# Patient Record
Sex: Male | Born: 1985 | Race: Black or African American | Hispanic: No
Health system: Southern US, Academic
[De-identification: ages and names within clinical notes are randomized; demographics above are authoritative.]

## PROBLEM LIST (undated history)

## (undated) ENCOUNTER — Telehealth

## (undated) ENCOUNTER — Encounter: Attending: Oncology | Primary: Oncology

## (undated) ENCOUNTER — Encounter

## (undated) ENCOUNTER — Telehealth: Attending: Hematology | Primary: Hematology

## (undated) ENCOUNTER — Encounter: Attending: Adult Health | Primary: Adult Health

## (undated) ENCOUNTER — Encounter: Attending: Hematology | Primary: Hematology

## (undated) ENCOUNTER — Ambulatory Visit: Payer: PRIVATE HEALTH INSURANCE

## (undated) ENCOUNTER — Encounter: Payer: PRIVATE HEALTH INSURANCE | Attending: Clinical | Primary: Clinical

## (undated) ENCOUNTER — Ambulatory Visit

## (undated) ENCOUNTER — Encounter: Payer: PRIVATE HEALTH INSURANCE | Attending: Hematology | Primary: Hematology

## (undated) ENCOUNTER — Telehealth: Attending: Oncology | Primary: Oncology

## (undated) ENCOUNTER — Encounter: Attending: Internal Medicine | Primary: Internal Medicine

## (undated) ENCOUNTER — Encounter
Attending: Student in an Organized Health Care Education/Training Program | Primary: Student in an Organized Health Care Education/Training Program

## (undated) ENCOUNTER — Encounter
Payer: PRIVATE HEALTH INSURANCE | Attending: Rehabilitative and Restorative Service Providers" | Primary: Rehabilitative and Restorative Service Providers"

## (undated) ENCOUNTER — Ambulatory Visit: Payer: MEDICAID

## (undated) ENCOUNTER — Encounter: Attending: Clinical | Primary: Clinical

## (undated) ENCOUNTER — Ambulatory Visit: Payer: PRIVATE HEALTH INSURANCE | Attending: Adult Health | Primary: Adult Health

## (undated) ENCOUNTER — Ambulatory Visit: Attending: Clinical | Primary: Clinical

## (undated) ENCOUNTER — Telehealth: Attending: Clinical | Primary: Clinical

## (undated) ENCOUNTER — Telehealth: Attending: Adult Health | Primary: Adult Health

## (undated) ENCOUNTER — Telehealth: Attending: Social Worker | Primary: Social Worker

## (undated) ENCOUNTER — Telehealth: Attending: Nephrology | Primary: Nephrology

## (undated) ENCOUNTER — Telehealth
Attending: Student in an Organized Health Care Education/Training Program | Primary: Student in an Organized Health Care Education/Training Program

## (undated) DIAGNOSIS — D571 Sickle-cell disease without crisis: Secondary | ICD-10-CM

## (undated) DIAGNOSIS — D5700 Hb-SS disease with crisis, unspecified: Secondary | ICD-10-CM

## (undated) HISTORY — PX: HERNIA REPAIR: SHX51

## (undated) HISTORY — PX: CHOLECYSTECTOMY: SHX55

---

## 2016-08-12 ENCOUNTER — Ambulatory Visit
Admit: 2016-08-12 | Discharge: 2016-08-12 | Payer: PRIVATE HEALTH INSURANCE | Attending: Internal Medicine | Primary: Internal Medicine

## 2016-08-12 DIAGNOSIS — Z09 Encounter for follow-up examination after completed treatment for conditions other than malignant neoplasm: Secondary | ICD-10-CM

## 2016-08-12 MED ORDER — OXYCODONE-ACETAMINOPHEN 5 MG-325 MG TAB
5-325 mg | ORAL_TABLET | ORAL | 0 refills | Status: DC
Start: 2016-08-12 — End: 2016-11-22

## 2016-08-12 NOTE — Progress Notes (Signed)
History:   John Acevedo is a 30 y.o. male presenting today for an initial visit for Establish Care and Hospital Follow Up [facility])    Pt presents to establish care and for follow-up after hospital discharge.  History is significant for sickle cell anemia and pain crises.  He was previously followed by his hematologist in [place].   He reports major pain crises involving his chest and abdomin usually twice per year.  Over the last month he has experienced 2 episodes.  He has responded well to ibuprofen and oxycodone in the past.  Pt was hospitalized at [facility] from 08/06/16 to 08/09/16 with c/o sickle cell pain.  Pt presented to ED c/o chest and abdominal pain x1 week with associated productive cough, SOB, and diarrhea. Evaluation in the ED revealed oxygen saturation into the 80s that improved with 2-3 L nasal canula.  An EKG showed NSR.  CXR showed basilar increased markings greater on the right that was possibly an early infiltrate.   He received IV normal saline, IV Zosyn and Zithromax.  Pt was admitted then placed on ceftriaxone and azithromycin.  A CBC revealed leukocytosis and hemoglobin of 6.9.  He received 2 units of prbc with improvement to 9.9.  He received pain control with oxycodone.  Leukocytosis improved and he remained afebrile during admission.  He was discharged in stable condition on azithromycin and cefdinir.  There has been mild improvement in pain since discharge.         Past Medical History:   Diagnosis Date   ??? Sickle cell disease (HCC)        Past Surgical History:   Procedure Laterality Date   ??? HX CHOLECYSTECTOMY     ??? HX HERNIA REPAIR         Social History     Social History   ??? Marital status: UNKNOWN     Spouse name: N/A   ??? Number of children: N/A   ??? Years of education: N/A     Occupational History   ??? Not on file.     Social History Main Topics   ??? Smoking status: Former Smoker     Packs/day: 1.00     Years: 4.00     Quit date: 08/13/2015   ??? Smokeless tobacco: Former Neurosurgeon    ??? Alcohol use No   ??? Drug use: No   ??? Sexual activity: Yes     Other Topics Concern   ??? Not on file     Social History Narrative   ??? No narrative on file       Family History   Problem Relation Age of Onset   ??? Diabetes Maternal Grandmother        No current outpatient prescriptions on file prior to visit.     No current facility-administered medications on file prior to visit.        No Known Allergies    Review of Systems   Constitutional: Negative for chills, fever, malaise/fatigue and weight loss.   HENT: Negative.    Eyes: Negative for blurred vision.   Respiratory: Positive for cough and sputum production. Negative for hemoptysis, shortness of breath and wheezing.    Cardiovascular: Negative for palpitations.   Gastrointestinal: Negative.    Genitourinary: Negative.    Musculoskeletal: Negative.    Skin: Negative.    Endo/Heme/Allergies: Negative.    Psychiatric/Behavioral: Negative.        Objective:   VS:    Visit Vitals   ???  BP 138/78 (BP 1 Location: Left arm, BP Patient Position: Sitting)   ??? Pulse 64   ??? Temp 98.3 ??F (36.8 ??C) (Oral)   ??? Resp 18   ??? Ht 5\' 8"  (1.727 m)   ??? Wt 133 lb (60.3 kg)   ??? SpO2 94%   ??? BMI 20.22 kg/m2     Physical Exam   Constitutional: He is oriented to person, place, and time. He appears well-developed and well-nourished.   HENT:   Head: Normocephalic and atraumatic.   Right Ear: External ear normal.   Left Ear: External ear normal.   Nose: Nose normal.   Mouth/Throat: Oropharynx is clear and moist.   Eyes: EOM are normal. Pupils are equal, round, and reactive to light.   Neck: Normal range of motion. Neck supple. No thyromegaly present.   Cardiovascular: Normal rate, regular rhythm, normal heart sounds and intact distal pulses.    Pulmonary/Chest: Effort normal and breath sounds normal.   Abdominal: Soft. Bowel sounds are normal. He exhibits no distension. There is tenderness (mild epigastric tenderness, no rebound or guarding).    Musculoskeletal: Normal range of motion. He exhibits no edema.   Lymphadenopathy:     He has no cervical adenopathy.   Neurological: He is alert and oriented to person, place, and time.   Skin: Skin is warm and dry.   Psychiatric: He has a normal mood and affect. His behavior is normal.   Nursing note and vitals reviewed.      Assessment/ Plan:     Diagnoses and all orders for this visit:    1. Hospital discharge follow-up        -     Cough and SOB improving, pneumonia suspected         -     Complete antibiotic therapy     2. H/O sickle cell anemia  -     REFERRAL TO HEMATOLOGY  -     CBC WITH AUTOMATED DIFF; Future    3. Sickle cell pain crisis (HCC)  -     oxyCODONE-acetaminophen (PERCOCET) 5-325 mg per tablet; TAKE 1 TO 2 TABLETS BY MOUTH EVERY 4 HOURS AS NEEDED FOR PAIN NO DRIVING WHILE ON MEDICATION  -     CBC WITH AUTOMATED DIFF; Future     I have discussed the diagnosis with the patient and the intended plan as seen in the above orders.  The patient verbalized understanding and agrees with the plan.      Follow-up Disposition: Not on File    Nance PewBert W Holmes III, MD

## 2016-08-12 NOTE — Patient Instructions (Signed)
Sickle Cell Disease: Care Instructions  Your Care Instructions    Sickle cell disease turns normal, round red blood cells into misshaped cells that look like sickles or crescent moons. The sickle-shaped cells can get stuck in blood vessels, blocking blood flow and causing severe pain. The sickle-shaped cells also can harm organs, muscles, and bones. It is a lifelong condition. Sickle cell disease is passed down in families. You can talk to your doctor about whether to have genetic tests to find out the chance of having a child with the disease. Your doctor also may recommend that your family members get tested for sickle cell disease.  Your doctor may treat you with medicines. Some people get blood transfusions or a bone marrow transplant. Managing pain is an important part of your treatment.  Follow-up care is a key part of your treatment and safety. Be sure to make and go to all appointments, and call your doctor if you are having problems. It's also a good idea to know your test results and keep a list of the medicines you take.  How can you care for yourself at home?  ?? Take your medicines exactly as prescribed. Call your doctor if you think you are having a problem with your medicine.  ?? Take pain medicines exactly as directed.  ?? If the doctor gave you a prescription medicine for pain, take it as prescribed.  ?? If you are not taking a prescription pain medicine, ask your doctor if you can take an over-the-counter medicine.  ?? Try to help ease pain by distracting yourself. Use guided imagery, deep breathing, and relaxation exercises. A pain specialist can teach you pain management skills.  ?? Avoid alcohol. It can make you dehydrated.  ?? Dress warmly in cold weather. The cold and windy weather can lead to severe pain.  ?? Do not smoke. Smoking can reduce the amount of oxygen in your blood. If you need help quitting, talk to your doctor about stop-smoking programs  and medicines. These can increase your chances of quitting for good.  ?? Get plenty of sleep.  ?? Get regular eye exams. Sickle cell disease can cause vision problems.  ?? Wear medical alert jewelry that says that you have sickle cell disease. You can buy this at most drugstores.  ?? Avoid colds and flu. Get a pneumococcal vaccine shot. If you have had one before, ask your doctor whether you need another dose. Get a flu shot every year. If you must be around people with colds or flu, wash your hands often.  When should you call for help?  Call 911 anytime you think you may need emergency care. For example, call if:  ?? You passed out (lost consciousness).  ?? You are in severe pain that is not helped by your pain medicines.  Call your doctor now or seek immediate medical care if:  ?? You have these signs of acute chest syndrome, a problem caused by sickle cell disease:  ?? Cough.  ?? Chest pain.  ?? Fever.  ?? Shortness of breath.  ?? You have vision problems.  ?? You have severe belly pain.  ?? You have a severe headache.  ?? You have less urine than normal or no urine.  ?? You have vomiting or diarrhea that does not go away after 2 hours.  ?? You are dizzy or lightheaded, or you feel like you may faint.  ?? You have sudden numbness or tingling in your hands, feet, fingers, or toes (  even if it goes away).  ?? You suddenly have poor balance and coordination when you walk (even if it goes away).  ?? You have an erection that lasts more than 2 to 3 hours or is extremely painful.  Watch closely for changes in your health, and be sure to contact your doctor if you have any problems.  Where can you learn more?  Go to InsuranceStats.cahttp://www.healthwise.net/GoodHelpConnections.  Enter L757 in the search box to learn more about "Sickle Cell Disease: Care Instructions."  Current as of: September 28, 2015  Content Version: 11.3  ?? 2006-2017 [employer]. Care instructions adapted under  license by [employer] (which disclaims liability or warranty for this information). If you have questions about a medical condition or this instruction, always ask your healthcare professional. [employer] disclaims any warranty or liability for your use of this information.       Sickle Cell Crisis: Care Instructions  Your Care Instructions    Sickle cell crisis is a painful episode that may begin suddenly in a person with sickle cell disease.  Sickle cell disease turns normal, round red blood cells into cells that look like sickles or crescent moons. The sickle-shaped cells can get stuck in blood vessels, blocking blood flow and causing severe pain. The pain can occur in the bones of the spine, the arms and legs, the chest, and the abdomen.  An episode may be called a "painful event" or "painful crisis." Some people who have sickle cell disease have many painful events, while others have few or none.  Treatment depends on the level of pain and how long it lasts. Sometimes taking nonprescription pain relievers can help. Or you may need stronger pain relief medicine that is prescribed or given by a doctor. You may need to be treated in the hospital.  It isn't always possible to know what sets off a painful event. But triggers include being dehydrated, cold temperatures, infection, stress, and not getting enough oxygen.  Follow-up care is a key part of your treatment and safety. Be sure to make and go to all appointments, and call your doctor if you are having problems. It's also a good idea to know your test results and keep a list of the medicines you take.  How can you care for yourself at home?  ?? Create a pain management plan with your doctor. This plan should include the types of medicines you can take and other actions you can take at home to relieve pain.  ?? Drink plenty of fluids, enough so that your urine is light yellow or  clear like water. If you have kidney, heart, or liver disease and have to limit fluids, talk with your doctor before you increase the amount of fluids you drink.  ?? Take your medicines exactly as prescribed. Call your doctor if you think you are having a problem with your medicine.  ?? Take pain medicines exactly as directed.  ?? If the doctor gave you a prescription medicine for pain, take it as prescribed.  ?? If you are not taking a prescription pain medicine, ask your doctor if you can take an over-the-counter medicine.  ?? Avoid alcohol. It can make you dehydrated.  ?? Dress warmly in cold weather. The cold and windy weather can lead to severe pain.  ?? Do not smoke. Smoking can reduce the amount of oxygen in your blood.  ?? Get plenty of sleep.  When should you call for help?  Call 911 anytime  you think you may need emergency care. For example, call if:  ?? You passed out (lost consciousness).  Call your doctor now or seek immediate medical care if:  ?? You are in severe pain.  ?? You are dizzy or lightheaded, or you feel like you may faint.  ?? You have a fever.  ?? You are short of breath.  ?? Your symptoms are getting worse.  Watch closely for changes in your health, and be sure to contact your doctor if you are not getting better as expected.  Where can you learn more?  Go to InsuranceStats.cahttp://www.healthwise.net/GoodHelpConnections.  Enter F104 in the search box to learn more about "Sickle Cell Crisis: Care Instructions."  Current as of: September 28, 2015  Content Version: 11.3  ?? 2006-2017 [employer]. Care instructions adapted under license by [employer] (which disclaims liability or warranty for this information). If you have questions about a medical condition or this instruction, always ask your healthcare professional. [employer] disclaims any warranty or liability for your use of this information.

## 2016-08-12 NOTE — Progress Notes (Signed)
Chief Complaint   Patient presents with   ??? Establish Care   ??? Hospital Follow Up     [facility]     1. Have you been to the ER, urgent care clinic since your last visit?  Hospitalized since your last visit?No    2. Have you seen or consulted any other health care providers outside of the [facility] since your last visit?  Include any pap smears or colon screening. new to office

## 2016-08-13 LAB — CBC WITH AUTOMATED DIFF
ABS. BASOPHILS: 0.1 10*3/uL (ref 0.0–0.2)
ABS. EOSINOPHILS: 0.2 10*3/uL (ref 0.0–0.4)
ABS. IMM. GRANS.: 0 10*3/uL (ref 0.0–0.1)
ABS. MONOCYTES: 0.7 10*3/uL (ref 0.1–0.9)
ABS. NEUTROPHILS: 3.7 10*3/uL (ref 1.4–7.0)
Abs Lymphocytes: 2 10*3/uL (ref 0.7–3.1)
BASOPHILS: 1 %
EOSINOPHILS: 2 %
HCT: 27.2 % — ABNORMAL LOW (ref 37.5–51.0)
HGB: 9.2 g/dL — ABNORMAL LOW (ref 12.6–17.7)
IMMATURE GRANULOCYTES: 0 %
Lymphocytes: 30 %
MCH: 31 pg (ref 26.6–33.0)
MCHC: 33.8 g/dL (ref 31.5–35.7)
MCV: 92 fL (ref 79–97)
MONOCYTES: 11 %
NEUTROPHILS: 56 %
PLATELET: 329 10*3/uL (ref 150–379)
RBC: 2.97 x10E6/uL — ABNORMAL LOW (ref 4.14–5.80)
RDW: 23.5 % — ABNORMAL HIGH (ref 12.3–15.4)
WBC: 6.6 10*3/uL (ref 3.4–10.8)

## 2016-08-13 LAB — SPECIMEN STATUS REPORT

## 2016-08-26 ENCOUNTER — Encounter

## 2016-08-26 ENCOUNTER — Ambulatory Visit: Admit: 2016-08-26 | Payer: PRIVATE HEALTH INSURANCE | Attending: Internal Medicine | Primary: Internal Medicine

## 2016-08-26 DIAGNOSIS — D571 Sickle-cell disease without crisis: Secondary | ICD-10-CM

## 2016-08-26 NOTE — Progress Notes (Signed)
Subjective:   John Acevedo is a 30 y.o.  male who presents for Sickle Cell Disease (follow up )    Pt reports doing well since his last visit.  He denies acute pain episodes.  Reviewed CBC.  H/H remains stable following transfusion.  Pt has no acute concerns at this time.    Review of Systems   Constitutional: Negative.    HENT: Negative.    Eyes: Negative.    Respiratory: Negative.    Cardiovascular: Negative.    Gastrointestinal: Negative.    Genitourinary: Negative.    Musculoskeletal: Negative.    Skin: Negative.    Neurological: Negative.    Endo/Heme/Allergies: Negative.    Psychiatric/Behavioral: Negative.        Current Outpatient Prescriptions on File Prior to Visit   Medication Sig Dispense Refill   ??? oxyCODONE-acetaminophen (PERCOCET) 5-325 mg per tablet TAKE 1 TO 2 TABLETS BY MOUTH EVERY 4 HOURS AS NEEDED FOR PAIN NO DRIVING WHILE ON MEDICATION 15 Tab 0     No current facility-administered medications on file prior to visit.        Reviewed PmHx, RxHx, FmHx, SocHx, AllgHx and updated and dated in the chart.    Nurse notes were reviewed and are correct    Objective:     Vitals:    08/26/16 0848   BP: 120/73   Pulse: 77   Resp: 18   Temp: 98.1 ??F (36.7 ??C)   TempSrc: Oral   SpO2: 98%   Weight: 132 lb (59.9 kg)   Height: 5\' 8"  (1.727 m)     Physical Exam   Constitutional: He is oriented to person, place, and time. He appears well-developed and well-nourished. No distress.   HENT:   Head: Normocephalic and atraumatic.   Eyes: EOM are normal. Pupils are equal, round, and reactive to light.   Neck: Normal range of motion. Neck supple.   Cardiovascular: Normal rate, regular rhythm, normal heart sounds and intact distal pulses.    Pulmonary/Chest: Effort normal and breath sounds normal.   Abdominal: Soft. Bowel sounds are normal. He exhibits no distension. There is no tenderness.   Musculoskeletal: He exhibits no edema.   Lymphadenopathy:     He has no cervical adenopathy.    Neurological: He is alert and oriented to person, place, and time.   Skin: Skin is warm and dry.   Nursing note and vitals reviewed.      Assessment/ Plan:     Diagnoses and all orders for this visit:    1. Hb-SS disease without crisis (HCC)      - Stable      - Pt will follow up with hematology    2. Encounter for immunization  -     Influenza virus vaccine (QUADRIVALENT PRES FREE SYRINGE) IM ([id number])       I have discussed the diagnosis with the patient and the intended plan as seen in the above orders.  The patient verbalized understanding and agrees with the plan.    Follow-up Disposition:  Return in about 3 months (around 11/25/2016) for follow up of chronic condition.    Nance PewBert W Holmes III, MD

## 2016-08-26 NOTE — Telephone Encounter (Signed)
Mr Batterman, who had an appt this morning with Dr Aline August called for a refill of his Percocet which was last prescribed on 08/12/16 for 15 tabs.

## 2016-08-26 NOTE — Patient Instructions (Signed)
Vaccine Information Statement    Influenza (Flu) Vaccine (Inactivated or Recombinant): What you need to know    Many Vaccine Information Statements are available in Spanish and other languages. See www.immunize.org/vis  Hojas de Informaci??n Sobre Vacunas est??n disponibles en Espa??ol y en muchos otros idiomas. Visite www.immunize.org/vis    1. Why get vaccinated?    Influenza (???flu???) is a contagious disease that spreads around the [place] every year, usually between October and May.     Flu is caused by influenza viruses, and is spread mainly by coughing, sneezing, and close contact.     Anyone can get flu. Flu strikes suddenly and can last several days. Symptoms vary by age, but can include:  ??? fever/chills  ??? sore throat  ??? muscle aches  ??? fatigue  ??? cough  ??? headache   ??? runny or stuffy nose    Flu can also lead to pneumonia and blood infections, and cause diarrhea and seizures in children.  If you have a medical condition, such as heart or lung disease, flu can make it worse.    Flu is more dangerous for some people. Infants and young children, people 65 years of age and older, pregnant women, and people with certain health conditions or a weakened immune system are at greatest risk.      Each year thousands of people in the [place] die from flu, and many more are hospitalized.     Flu vaccine can:  ??? keep you from getting flu,  ??? make flu less severe if you do get it, and  ??? keep you from spreading flu to your family and other people.     2. Inactivated and recombinant flu vaccines    A dose of flu vaccine is recommended every flu season. Children 6 months through 8 years of age may need two doses during the same flu season.  Everyone else needs only one dose each flu season.       Some inactivated flu vaccines contain a very small amount of a mercury-based preservative called thimerosal. Studies have not shown thimerosal in vaccines to be harmful, but flu vaccines that do not contain  thimerosal are available.    There is no live flu virus in flu shots.  They cannot cause the flu.     There are many flu viruses, and they are always changing. Each year a new flu vaccine is made to protect against three or four viruses that are likely to cause disease in the upcoming flu season. But even when the vaccine doesn???t exactly match these viruses, it may still provide some protection    Flu vaccine cannot prevent:  ??? flu that is caused by a virus not covered by the vaccine, or  ??? illnesses that look like flu but are not.    It takes about 2 weeks for protection to develop after vaccination, and protection lasts through the flu season.     3. Some people should not get this vaccine    Tell the person who is giving you the vaccine:    ??? If you have any severe, life-threatening allergies.    If you ever had a life-threatening allergic reaction after a dose of flu vaccine, or have a severe allergy to any part of this vaccine, you may be advised not to get vaccinated.  Most, but not all, types of flu vaccine contain a small amount of egg protein.       ??? If you   ever had Guillain-Barr?? Syndrome (also called GBS).   Some people with a history of GBS should not get this vaccine. This should be discussed with your doctor.    ??? If you are not feeling well.    It is usually okay to get flu vaccine when you have a mild illness, but you might be asked to come back when you feel better.      4. Risks of a vaccine reaction    With any medicine, including vaccines, there is a chance of reactions. These are usually mild and go away on their own, but serious reactions are also possible.     Most people who get a flu shot do not have any problems with it.     Minor problems following a flu shot include:   ??? soreness, redness, or swelling where the shot was given    ??? hoarseness  ??? sore, red or itchy eyes  ??? cough  ??? fever  ??? aches  ??? headache  ??? itching  ??? fatigue   If these problems occur, they usually begin soon after the shot and last 1 or 2 days.     More serious problems following a flu shot can include the following:    ??? There may be a small increased risk of Guillain-Barr?? Syndrome (GBS) after inactivated flu vaccine.  This risk has been estimated at 1 or 2 additional cases per million people vaccinated. This is much lower than the risk of severe complications from flu, which can be prevented by flu vaccine.      ??? Young children who get the flu shot along with pneumococcal vaccine (PCV13) and/or DTaP vaccine at the same time might be slightly more likely to have a seizure caused by fever. Ask your doctor for more information. Tell your doctor if a child who is getting flu vaccine has ever had a seizure.     Problems that could happen after any injected vaccine:     ??? People sometimes faint after a medical procedure, including vaccination. Sitting or lying down for about 15 minutes can help prevent fainting, and injuries caused by a fall. Tell your doctor if you feel dizzy, or have vision changes or ringing in the ears.    ??? Some people get severe pain in the shoulder and have difficulty moving the arm where a shot was given. This happens very rarely.    ??? Any medication can cause a severe allergic reaction. Such reactions from a vaccine are very rare, estimated at about 1 in a million doses, and would happen within a few minutes to a few hours after the vaccination.    As with any medicine, there is a very remote chance of a vaccine causing a serious injury or death.    The safety of vaccines is always being monitored. For more information, visit: www.cdc.gov/vaccinesafety/    5. What if there is a serious reaction?    What should I look for?    ??? Look for anything that concerns you, such as signs of a severe allergic reaction, very high fever, or unusual behavior.    Signs of a severe allergic reaction can include hives, swelling of the  face and throat, difficulty breathing, a fast heartbeat, dizziness, and weakness ??? usually within a few minutes to a few hours after the vaccination.    What should I do?    ??? If you think it is a severe allergic reaction or other emergency that   can???t wait, call 9-1-1 and get the person to the nearest hospital. Otherwise, call your doctor.    ??? Reactions should be reported to the Vaccine Adverse Event Reporting System (VAERS). Your doctor should file this report, or you can do it yourself through  the VAERS web site at www.vaers.hhs.gov, or by calling 1-800-822-7967.    VAERS does not give medical advice.    6. The National Vaccine Injury Compensation Program    The National Vaccine Injury Compensation Program (VICP) is a federal program that was created to compensate people who may have been injured by certain vaccines.    Persons who believe they may have been injured by a vaccine can learn about the program and about filing a claim by calling 1-800-338-2382 or visiting the VICP website at www.hrsa.gov/vaccinecompensation.  There is a time limit to file a claim for compensation.    7. How can I learn more?  ??? Ask your healthcare provider. He or she can give you the vaccine package insert or suggest other sources of information.  ??? Call your local or state health department.  ??? Contact the Centers for Disease Control and Prevention (CDC):  - Call 1-800-232-4636 (1-800-CDC-INFO) or  - Visit CDC???s website at www.cdc.gov/flu    Vaccine Information Statement   Inactivated Influenza Vaccine   07/22/2014  42 [place] ?? 300aa-[id number]    Department of Health and Human Services  Centers for Disease Control and Prevention    Office Use Only

## 2016-08-26 NOTE — Progress Notes (Signed)
Chief Complaint   Patient presents with   ??? Sickle Cell Disease     follow up      1. Have you been to the ER, urgent care clinic since your last visit?  Hospitalized since your last visit?No    2. Have you seen or consulted any other health care providers outside of the [facility] since your last visit?  Include any pap smears or colon screening. No

## 2016-10-16 ENCOUNTER — Ambulatory Visit
Admit: 2016-10-16 | Discharge: 2016-10-16 | Payer: PRIVATE HEALTH INSURANCE | Attending: Internal Medicine | Primary: Internal Medicine

## 2016-10-16 DIAGNOSIS — D571 Sickle-cell disease without crisis: Secondary | ICD-10-CM

## 2016-10-16 NOTE — Progress Notes (Signed)
Subjective:   John Acevedo is a 30 y.o.  male with a history of sickle cell disease who presents for Joint Swelling    Pt reports a 2 week history of intermittent pain and swelling of hands and knees bilaterally.  He denies history of trauma, warmth or redness.  Symptoms do not interfere with daily activities and have now improved.  He denies prior symptoms.  He treated his symptoms with ibuprofen with good response.  He denies fever/chills, SOB, chest pain, n/v, or abdominal pain.  He is scheduled for follow up with hematology for sickle cell disease later this week on 10/18/16.  He     Review of Systems   Constitutional: Negative for chills, fever and malaise/fatigue.   HENT: Negative.    Eyes: Negative.    Respiratory: Negative.    Cardiovascular: Negative.    Gastrointestinal: Negative.    Genitourinary: Negative.    Musculoskeletal: Positive for joint pain. Negative for back pain, myalgias and neck pain.   Skin: Negative.    Neurological: Negative.        Current Outpatient Prescriptions on File Prior to Visit   Medication Sig Dispense Refill   ??? oxyCODONE-acetaminophen (PERCOCET) 5-325 mg per tablet TAKE 1 TO 2 TABLETS BY MOUTH EVERY 4 HOURS AS NEEDED FOR PAIN NO DRIVING WHILE ON MEDICATION 15 Tab 0     No current facility-administered medications on file prior to visit.        Reviewed PmHx, RxHx, FmHx, SocHx, AllgHx and updated and dated in the chart.    Nurse notes were reviewed and are correct    Objective:     Vitals:    10/16/16 1436   BP: 138/85   Pulse: 99   Resp: 18   Temp: 98.4 ??F (36.9 ??C)   TempSrc: Oral   SpO2: 98%   Weight: 134 lb (60.8 kg)   Height: 5\' 8"  (1.727 m)     Physical Exam   Constitutional: He is oriented to person, place, and time. He appears well-developed and well-nourished. No distress.   HENT:   Head: Normocephalic and atraumatic.   Mouth/Throat: Oropharynx is clear and moist.   Eyes: Conjunctivae and EOM are normal. Pupils are equal, round, and  reactive to light. No scleral icterus.   Neck: Normal range of motion. Neck supple.   Cardiovascular: Normal rate, regular rhythm, normal heart sounds and intact distal pulses.  Exam reveals no gallop and no friction rub.    No murmur heard.  Pulmonary/Chest: Effort normal and breath sounds normal.   Abdominal: Soft. Bowel sounds are normal. He exhibits no distension. There is no tenderness.   No hepatosplenomegaly   Musculoskeletal:   Left hand/wrist: no deformity, no edema, no erythema, no warmth, FROM, NTTP, neurovascularly intact    Right hand/wrist: no deformity, no edema, no erythema, no warmth, FROM, NTTP, neurovascularly intact    Left knee: no deformity, no effusion, no erythema, no warmth, FROM, no crepitus, anterior/posterior drawer negative, valgus and varus stress negative, NTTP    Right knee: no deformity, no effusion, no erythema, no warmth, FROM, no crepitus, anterior/posterior drawer negative, valgus and varus stress negative, NTTP   Lymphadenopathy:     He has no cervical adenopathy.   Neurological: He is alert and oriented to person, place, and time.   Normal gait   Skin: Skin is warm and dry. No erythema.   Nursing note and vitals reviewed.      Assessment/ Plan:     Diagnoses  and all orders for this visit:    1. Hb-SS disease without crisis (HCC)      -   Stable      -   Pt will follow up with hematology in 2 days on 10/18/16    2. Arthralgia of both hands      -   Pt asymptomatic at this time      -   Exam is unremarkable      -   May use ibuprofen prn      -   Will continue to monitor      -   Instructed to seek medical attention should symptoms return    3. Arthralgia of both knees      -   Pt asymptomatic at this time      -   Exam is unremarkable      -   May use ibuprofen prn      -   Will continue to monitor      -   Instructed to seek medical attention should symptoms return       I have discussed the diagnosis with the patient and the intended plan as  seen in the above orders.  The patient verbalized understanding and agrees with the plan.    Follow-up Disposition:  Return if symptoms worsen or fail to improve.    Nance PewBert W Holmes III, MD

## 2016-10-16 NOTE — Progress Notes (Signed)
Chief Complaint   Patient presents with   ??? Joint Swelling   1. Have you been to the ER, urgent care clinic since your last visit?  Hospitalized since your last visit?No    2. Have you seen or consulted any other health care providers outside of the [facility] since your last visit?  Include any pap smears or colon screening. No

## 2016-11-22 ENCOUNTER — Ambulatory Visit
Admit: 2016-11-22 | Discharge: 2016-11-22 | Payer: PRIVATE HEALTH INSURANCE | Attending: Internal Medicine | Primary: Internal Medicine

## 2016-11-22 DIAGNOSIS — Z09 Encounter for follow-up examination after completed treatment for conditions other than malignant neoplasm: Secondary | ICD-10-CM

## 2016-11-22 MED ORDER — OXYCODONE-ACETAMINOPHEN 5 MG-325 MG TAB
5-325 mg | ORAL_TABLET | Freq: Three times a day (TID) | ORAL | 0 refills | Status: DC | PRN
Start: 2016-11-22 — End: 2017-04-28

## 2016-11-22 NOTE — Progress Notes (Signed)
Chief Complaint   Patient presents with   ??? Hospital Follow Up   1. Have you been to the ER, urgent care clinic since your last visit?  Hospitalized since your last visit?Yes When: [facility] this week for sickle cell    2. Have you seen or consulted any other health care providers outside of the [facility] since your last visit?  Include any pap smears or colon screening. No

## 2016-11-22 NOTE — Progress Notes (Signed)
Subjective:   John Acevedo is a 30 y.o. year old male who presents for Hospital Follow Up    Pt presents today for hospital discharge follow up after evaluation [facility] ED [facility] on 11/11/16 due to sickle cell crisis.  Typically his pain responds to tylenol or ibuprofen, but this time it did not prompting him to go to the ED for further evaluation.  A PA/LAT CXR in the ED demonstrated no acute cardiopulmonary process.  CBC showed mildly elevated WBC count and H/H consistent with baseline.  Metabolic panel was uremarkable.  He received IV hydration and pain medication with improvement in symptoms.  He was discharged in stable condition with a prescription for percocet.  He does not currently take hydroxyurea.  He reports symptoms as improved since discharge, but reports more frequent pain crises affecting multiple joints over the past year.  He denies fever/chills, headache, SOB, chest pain, or priapism.  Pt will follow up with his hematologist (Dr. Irving BurtonGauri Radkar).      Review of Systems   Constitutional: Positive for malaise/fatigue (chronic). Negative for chills, diaphoresis and fever.   HENT: Negative.    Eyes: Negative.    Respiratory: Negative for cough, hemoptysis, sputum production, shortness of breath and wheezing.    Cardiovascular: Positive for palpitations (occasional ). Negative for chest pain, orthopnea, claudication, leg swelling and PND.   Gastrointestinal: Negative.    Genitourinary: Negative.    Musculoskeletal: Positive for joint pain.   Skin: Negative.    Neurological: Negative.  Negative for weakness.   Endo/Heme/Allergies: Negative.        Current Outpatient Prescriptions on File Prior to Visit   Medication Sig Dispense Refill   ??? oxyCODONE-acetaminophen (PERCOCET) 5-325 mg per tablet TAKE 1 TO 2 TABLETS BY MOUTH EVERY 4 HOURS AS NEEDED FOR PAIN NO DRIVING WHILE ON MEDICATION 15 Tab 0     No current facility-administered medications on file prior to visit.         Reviewed PmHx, RxHx, FmHx, SocHx, AllgHx and updated and dated in the chart.    Nurse notes were reviewed and are correct    Objective:     Vitals:    11/22/16 0939   Height: 5\' 8"  (1.727 m)     Physical Exam   Constitutional: He is oriented to person, place, and time. He appears well-developed and well-nourished.   HENT:   Head: Normocephalic and atraumatic.   Mouth/Throat: Oropharynx is clear and moist.   Eyes: Conjunctivae and EOM are normal. Pupils are equal, round, and reactive to light.   Neck: Normal range of motion. Neck supple.   Cardiovascular: Normal rate, regular rhythm, normal heart sounds and intact distal pulses.    Pulmonary/Chest: Effort normal and breath sounds normal.   Abdominal: Soft. Bowel sounds are normal. He exhibits no distension and no mass. There is no tenderness.   Neurological: He is alert and oriented to person, place, and time.   Skin: Skin is warm and dry.   Nursing note and vitals reviewed.      Assessment/ Plan:     Diagnoses and all orders for this visit:    1. Hospital discharge follow-up    2. Sickle-cell disease with pain (HCC)  -     CBC WITH AUTOMATED DIFF; Future  -     oxyCODONE-acetaminophen (PERCOCET) 5-325 mg per tablet; Take 1 Tab by mouth every eight (8) hours as needed for Pain. Max Daily Amount: 3 Tabs.  -  May benefit from hydroxyurea        -     Pt will follow up with hematology      I have discussed the diagnosis with the patient and the intended plan as seen in the above orders.  The patient verbalized understanding and agrees with the plan.    Follow-up Disposition: Not on File    Nance PewBert W Holmes III, MD

## 2016-11-22 NOTE — Addendum Note (Signed)
Addended by: Arthor CaptainMANNING, Rebecka Oelkers on: 11/22/2016 11:10 AM      Modules accepted: Orders

## 2016-11-23 LAB — DIFFERENTIAL, MANUAL
ABSOLUTE METAMYELOCYTES: 0.1 10*3/uL — ABNORMAL HIGH (ref <=0.0)
BASOPHILS C MAN (DIFF): 4 % — ABNORMAL HIGH (ref 0–2)
Bands%-DIF: 1 % (ref 0–5)
Basophils abs-DIF: 0.5 10*3/uL — ABNORMAL HIGH (ref 0.0–0.2)
EOSINOPHILS C MAN (DIFF): 3 % (ref 0–6)
Eos abs-DIF: 0.4 10*3/uL (ref 0.0–0.5)
LYMPHOCYTES C MAN (DIFF): 37 % (ref 27–45)
Lymphs abs-DIF: 4.8 10*3/uL (ref 1.0–4.8)
METAMYELOCTYES C MAN (DIFF): 1 % — ABNORMAL HIGH (ref <=0)
MONOCYTES C MAN (DIFF): 9 % (ref 3–9)
MYELOCYTE C MAN (DIFF): 2 % — ABNORMAL HIGH (ref <=0)
Monocytes abs-DIF: 1.2 10*3/uL — ABNORMAL HIGH (ref 0.1–0.9)
Myelocytes abs-DIF: 0.2 10*3/uL — ABNORMAL HIGH (ref <=0.0)
NEUTROPHILS C MAN (DIFF): 43 % (ref 40–75)
NRBC: 3 /100{WBCs} — ABNORMAL HIGH (ref 0–0)
Neutrophils abs: 5.8 10*3/uL (ref 1.8–7.7)
SMEAR EVAL: ADEQUATE

## 2016-11-23 LAB — CBC WITH AUTOMATED DIFF
HCT: 24.4 % — ABNORMAL LOW (ref 36.6–51.9)
HGB: 8.5 g/dL — ABNORMAL LOW (ref 13.2–17.3)
MCH: 31 pg (ref 26–34)
MCHC: 35 g/dL (ref 32–36)
MCV: 88 fL (ref 80–95)
MPV: 11.2 fL — ABNORMAL HIGH (ref 6.0–10.8)
PLATELET: 232 10*3/uL (ref 140–440)
RBC: 2.77 M/uL — ABNORMAL LOW (ref 3.80–5.80)
RDW: 24.6 % — ABNORMAL HIGH (ref 10.0–16.0)
WBC: 10.7 10*3/uL (ref 4.0–11.0)

## 2017-04-28 ENCOUNTER — Ambulatory Visit
Admit: 2017-04-28 | Discharge: 2017-04-28 | Payer: PRIVATE HEALTH INSURANCE | Attending: Internal Medicine | Primary: Internal Medicine

## 2017-04-28 DIAGNOSIS — D5700 Hb-SS disease with crisis, unspecified: Secondary | ICD-10-CM

## 2017-04-28 MED ORDER — OXYCODONE-ACETAMINOPHEN 5 MG-325 MG TAB
5-325 mg | ORAL_TABLET | Freq: Three times a day (TID) | ORAL | 0 refills | Status: DC | PRN
Start: 2017-04-28 — End: 2017-10-09

## 2017-04-28 NOTE — Progress Notes (Signed)
Subjective:   John Acevedo is a 31 y.o.  male who presents for follow up of sickle cell Leata Mousedisease.    John Acevedo returns today for follow up.  History is significant for sickle cell disease.  He was last seen in 11/2016.  He reports doing well overall and denies any hospitalizations or ER visits since his last visit.  He has established care with sickle cell specialist [facility] and is taking hydroxyurea.  Anemia has been stable.  He reports that his pain is mostly controlled with tylenol and ibuprofen, but he will occasionally experience intense pain episodes of his lower back and knees that responds to oxycodone.  He has no new concerns at this time.  He denies fever/chills, cough SOB, chest pain, or headache.  Pt reports that he is smoking approximately 1/2 ppd and is currently attempting to quit on his own.      Review of Systems   Constitutional: Negative for chills and fever.   HENT: Negative.    Eyes: Negative.    Respiratory: Negative for cough, shortness of breath and wheezing.    Cardiovascular: Negative for chest pain, palpitations, orthopnea, claudication, leg swelling and PND.   Gastrointestinal: Negative.    Genitourinary: Negative.    Musculoskeletal: Positive for back pain and joint pain.   Skin: Negative.    Neurological: Negative for dizziness and headaches.       No current outpatient prescriptions on file prior to visit.     No current facility-administered medications on file prior to visit.        Reviewed PmHx, RxHx, FmHx, SocHx, AllgHx and updated and dated in the chart.    Nurse notes were reviewed and are correct    Objective:     Vitals:    04/28/17 1612   BP: 118/68   Pulse: 96   Resp: 13   Temp: 97.8 ??F (36.6 ??C)   TempSrc: Oral   SpO2: 92%   Weight: 132 lb 6.4 oz (60.1 kg)   Height: 5\' 8"  (1.727 m)     Physical Exam   Constitutional: He is oriented to person, place, and time. He appears well-developed and well-nourished.   HENT:    Head: Normocephalic and atraumatic.   Eyes: EOM are normal. Pupils are equal, round, and reactive to light.   Neck: Normal range of motion. Neck supple.   Cardiovascular: Normal rate, regular rhythm, normal heart sounds and intact distal pulses.    Pulmonary/Chest: Effort normal and breath sounds normal.   Abdominal: Soft. Bowel sounds are normal. He exhibits no distension. There is no tenderness.   No hepatosplenomegaly   Musculoskeletal:   Clubbing of hand bilaterally, no cyanosis, no edema   Neurological: He is alert and oriented to person, place, and time.   Skin: Skin is warm and dry.   Nursing note and vitals reviewed.      Assessment/ Plan:     Diagnoses and all orders for this visit:    1. Sickle-cell disease with pain (HCC)        -     Advised to maintain good hydration        -     Follow up with hematology as scheduled  -     oxyCODONE-acetaminophen (PERCOCET) 5-325 mg per tablet; Take 1 Tab by mouth every eight (8) hours as needed for Pain. Max Daily Amount: 3 Tabs.    2. Tobacco use disorder        -  Continue efforts with smoking cessation       I have discussed the diagnosis with the patient and the intended plan as seen in the above orders.  The patient verbalized understanding and agrees with the plan.    Follow-up Disposition:  Return in about 3 months (around 07/29/2017) for sickle cell, tobacco use.    Nance Pew, MD

## 2017-04-28 NOTE — Progress Notes (Signed)
John Acevedo is a 31 y.o.@ presents today for office visit for follow up . Pt is in Room # 5.    ??1. Have you been to the ER, urgent care clinic since your last visit?  Hospitalized since your last visit?No    2. Have you seen or consulted any other health care providers outside of the [facility] since your last visit?  Include any pap smears or colon screening. Yes [facility] in April.    ??    Health Maintenance reviewed - discussed..    Requested Prescriptions     Signed Prescriptions Disp Refills   ??? oxyCODONE-acetaminophen (PERCOCET) 5-325 mg per tablet 15 Tab 0     Sig: Take 1 Tab by mouth every eight (8) hours as needed for Pain. Max Daily Amount: 3 Tabs.       Visit Vitals   ??? BP 118/68 (BP 1 Location: Right arm, BP Patient Position: Sitting)   ??? Pulse 96   ??? Temp 97.8 ??F (36.6 ??C) (Oral)   ??? Resp 13   ??? Ht 5\' 8"  (1.727 m)   ??? Wt 132 lb 6.4 oz (60.1 kg)   ??? SpO2 92%   ??? BMI 20.13 kg/m2       ??  Upcoming Appts  No.  ??  VORB:    Nance Pew/Bert W Holmes III, MD Bunnie Pion/Wassim Kirksey, LPN

## 2017-07-29 ENCOUNTER — Encounter

## 2017-07-29 ENCOUNTER — Ambulatory Visit
Admit: 2017-07-29 | Discharge: 2017-07-29 | Payer: PRIVATE HEALTH INSURANCE | Attending: Internal Medicine | Primary: Internal Medicine

## 2017-07-29 DIAGNOSIS — D571 Sickle-cell disease without crisis: Secondary | ICD-10-CM

## 2017-07-29 MED ORDER — IBUPROFEN 800 MG TAB
800 mg | ORAL_TABLET | Freq: Three times a day (TID) | ORAL | 0 refills | Status: DC | PRN
Start: 2017-07-29 — End: 2017-09-24

## 2017-07-29 MED ORDER — HYDROXYUREA 500 MG CAPSULE
500 mg | ORAL_CAPSULE | Freq: Every day | ORAL | 2 refills | Status: DC
Start: 2017-07-29 — End: 2017-11-20

## 2017-07-29 MED ORDER — DIPHTH,PERTUS(ACEL)TETANUS VAC(PF) 2 LF-(5-3-5MCG)-5 LF/0.5 ML IM SUSP
2 Lf-(.5-5-3-5 mcg)-5Lf/0.5 mL | INJECTION | Freq: Once | INTRAMUSCULAR | 0 refills | Status: AC
Start: 2017-07-29 — End: 2017-07-29

## 2017-07-29 NOTE — Progress Notes (Signed)
A query of the [employer] [employer] was performed and notified PCP.

## 2017-07-29 NOTE — Progress Notes (Signed)
Subjective:   Hesston Hitchens is a 31 y.o.  male who presents for Follow-up    Mr. Byers returns today for follow up.  History is significant for sickle cell disease.  He experienced a pain crisis last month while in [place].  He presented to [facility] ER on 06/21/17 due to chest and back pain.  A CXR PA/Lat showed no acute cardiopulmonary abnormality.  EKG showed NSR with ST elevation likely due to early repolarization, but otherwise normal.  H/H were stable.  LDH was elevated.  He received pain medication with subsequent improvement in symptoms.  He was discharged in stable condition.  He reports no pain since discharge.  He is taking hydroxyurea as prescribed.  He missed his follow-up with his sickle cell specialist (Dr. Oretha Milch) and has rescheduled for later this month.  He continues to smoke a little less than 1/2 ppd day.  He declines assistance with smoking cessation at this time.  He has no acute concerns at this time.    Review of Systems   Constitutional: Negative for chills and fever.   Respiratory: Negative for cough and shortness of breath.    Cardiovascular: Negative for chest pain, palpitations, orthopnea, claudication, leg swelling and PND.   Gastrointestinal: Negative for abdominal pain, nausea and vomiting.   Musculoskeletal: Negative for back pain, joint pain and myalgias.   Neurological: Negative for dizziness and headaches.       Current Outpatient Prescriptions on File Prior to Visit   Medication Sig Dispense Refill   ??? oxyCODONE-acetaminophen (PERCOCET) 5-325 mg per tablet Take 1 Tab by mouth every eight (8) hours as needed for Pain. Max Daily Amount: 3 Tabs. 15 Tab 0     No current facility-administered medications on file prior to visit.        Reviewed PmHx, RxHx, FmHx, SocHx, AllgHx and updated and dated in the chart.    Nurse notes were reviewed and are correct    Objective:     Vitals:    07/29/17 0936   BP: 108/67   Pulse: 87   Resp: 13   Temp: 97.2 ??F (36.2 ??C)   TempSrc: Oral    SpO2: 92%   Weight: 135 lb 9.6 oz (61.5 kg)   Height: 5\' 8"  (1.727 m)     Physical Exam   Constitutional: He is oriented to person, place, and time. He appears well-developed and well-nourished.   HENT:   Head: Normocephalic and atraumatic.   Right Ear: External ear normal.   Left Ear: External ear normal.   Nose: Nose normal.   Mouth/Throat: Oropharynx is clear and moist.   Eyes: Conjunctivae and EOM are normal. Pupils are equal, round, and reactive to light.   Neck: Normal range of motion. Neck supple.   Cardiovascular: Normal rate, regular rhythm, normal heart sounds and intact distal pulses.    Pulmonary/Chest: Effort normal and breath sounds normal.   Abdominal: Soft. Bowel sounds are normal. He exhibits no distension. There is no tenderness.   Musculoskeletal: Normal range of motion. He exhibits no edema.   Lymphadenopathy:     He has no cervical adenopathy.   Neurological: He is alert and oriented to person, place, and time.   Skin: Skin is warm and dry.   Psychiatric: He has a normal mood and affect.   Nursing note and vitals reviewed.      Assessment/ Plan:     Diagnoses and all orders for this visit:    1. Hb-SS disease  without crisis (HCC)  -     hydroxyurea (HYDREA) 500 mg capsule; Take 1 Cap by mouth daily.  -     CBC WITH AUTOMATED DIFF; Future  -     ibuprofen (MOTRIN) 800 mg tablet; Take 1 Tab by mouth every eight (8) hours as needed for Pain. Indications: Pain  -     Follow up with Dr. Okey Duprerawford as scheduled    2. Smoking 1/2 pack a day or less        -     Counseled on the importance of smoking cessation    3. Encounter for immunization  -     diph,Pertuss,Acell,,Tet Vac-PF (ADACEL) 2 Lf-(2.5-5-3-5 mcg)-5Lf/0.5 mL susp; 0.5 mL by IntraMUSCular route once for 1 dose.       I have discussed the diagnosis with the patient and the intended plan as seen in the above orders.  The patient verbalized understanding and agrees with the plan.    Follow-up Disposition:   Return in about 6 months (around 01/29/2018) for routine follow-up.    Nance PewBert W Holmes III, MD

## 2017-07-29 NOTE — Progress Notes (Signed)
John Acevedo is a 31 y.o.@ presents today for office visit for follow up. Pt is in Room # 4.    ??1. Have you been to the ER, urgent care clinic since your last visit?  Hospitalized since your last visit?Yes When: [facility] for sicke cell crisis.    2. Have you seen or consulted any other health care providers outside of the [facility] since your last visit?  Include any pap smears or colon screening. No    ??    Health Maintenance reviewed - ordered..    Requested Prescriptions     Pending Prescriptions Disp Refills   ??? hydroxyurea (HYDREA) 500 mg capsule  2     Sig: Take 1 Cap by mouth daily.       Visit Vitals   ??? BP 108/67 (BP 1 Location: Left arm, BP Patient Position: Sitting)   ??? Pulse 87   ??? Temp 97.2 ??F (36.2 ??C) (Oral)   ??? Resp 13   ??? Ht 5\' 8"  (1.727 m)   ??? Wt 135 lb 9.6 oz (61.5 kg)   ??? SpO2 92%   ??? BMI 20.62 kg/m2       ??  Upcoming Appts  No.  ??  VORB: No orders of the defined types were placed in this encounter.   Doristine Church/Bert Netta CorriganW Holmes III, MD Bunnie Pion/Jaymarie Yeakel, LPN

## 2017-07-29 NOTE — Telephone Encounter (Signed)
Called pt to inform him his perscrption is at the front dest. Pt was seen at the office on 8/14. I called him and left message that it was ready.

## 2017-07-30 LAB — CBC WITH AUTOMATED DIFF
HCT: 24.3 % — ABNORMAL LOW (ref 36.6–51.9)
HGB: 8.2 g/dL — ABNORMAL LOW (ref 13.2–17.3)
MCH: 33 pg (ref 26–34)
MCHC: 34 g/dL (ref 31–36)
MCV: 99 fL — ABNORMAL HIGH (ref 80–95)
MPV: 10.6 fL (ref 9.0–13.0)
PLATELET: 245 10*3/uL (ref 140–440)
RBC: 2.46 M/uL — ABNORMAL LOW (ref 3.80–5.80)
RDW: 23.7 % — ABNORMAL HIGH (ref 10.0–15.5)
WBC: 9.4 10*3/uL (ref 4.0–11.0)

## 2017-07-30 LAB — DIFFERENTIAL, MANUAL
BASOPHILS C MAN (DIFF): 2 % (ref 0–2)
Basophils abs-DIF: 0.2 10*3/uL (ref 0.0–0.2)
EOSINOPHILS C MAN (DIFF): 2 % (ref 0–6)
Eos abs-DIF: 0.2 10*3/uL (ref 0.0–0.5)
LYMPHOCYTES C MAN (DIFF): 36 % (ref 20–45)
Lymphs abs-DIF: 4 10*3/uL (ref 1.0–4.8)
MONOCYTES C MAN (DIFF): 12 % (ref 3–12)
Monocytes abs-DIF: 1.3 10*3/uL — ABNORMAL HIGH (ref 0.1–1.0)
NEUTROPHILS C MAN (DIFF): 49 % (ref 40–75)
NRBC: 3 /100{WBCs} — ABNORMAL HIGH (ref 0–0)
Neutrophils abs: 5.5 10*3/uL (ref 1.8–7.7)
SMEAR EVAL: NORMAL

## 2017-08-12 NOTE — Progress Notes (Signed)
Spoke with pt in regards to lab results. Two pt identifier's and permission to release verified. Relayed Dr.'s notes. Pt acknowledges understanding and voices no concerns at this time.

## 2017-08-12 NOTE — Progress Notes (Signed)
Anemia is stable.  Continue hydroxyurea.  Follow up with hematology as scheduled.

## 2017-09-24 ENCOUNTER — Encounter

## 2017-09-25 MED ORDER — IBUPROFEN 800 MG TAB
800 mg | ORAL_TABLET | ORAL | 0 refills | Status: DC
Start: 2017-09-25 — End: 2017-11-07

## 2017-10-09 ENCOUNTER — Encounter: Attending: Internal Medicine | Primary: Internal Medicine

## 2017-10-09 ENCOUNTER — Ambulatory Visit
Admit: 2017-10-09 | Discharge: 2017-10-09 | Payer: PRIVATE HEALTH INSURANCE | Attending: Internal Medicine | Primary: Internal Medicine

## 2017-10-09 DIAGNOSIS — R8290 Unspecified abnormal findings in urine: Secondary | ICD-10-CM

## 2017-10-09 LAB — AMB POC URINALYSIS DIP STICK AUTO W/ MICRO
Bilirubin (UA POC): NEGATIVE
Glucose (UA POC): NEGATIVE
Ketones (UA POC): NEGATIVE
Leukocyte esterase (UA POC): NEGATIVE
Nitrites (UA POC): NEGATIVE
Specific gravity (UA POC): 1.025 (ref 1.001–1.035)
Urobilinogen (UA POC): 0.2 (ref 0.2–1)
pH (UA POC): 5.5 (ref 4.6–8.0)

## 2017-10-09 MED ORDER — OXYCODONE-ACETAMINOPHEN 5 MG-325 MG TAB
5-325 mg | ORAL_TABLET | Freq: Three times a day (TID) | ORAL | 0 refills | Status: DC | PRN
Start: 2017-10-09 — End: 2017-11-20

## 2017-10-09 NOTE — Progress Notes (Signed)
John Acevedo is a 31 y.o. male who presents for routine immunizations.   He denies any symptoms , reactions or allergies that would exclude them from being immunized today.  Risks and adverse reactions were discussed and the VIS was given to them. All questions were addressed.  He was observed for10 min post injection. There were no reactions observed.    Bunnie PionLeslie Lamis Behrmann, LPN      John Acevedo is a 31 y.o.@ presents today for office visit for follow up. Pt is in Room # 4.    ??1. Have you been to the ER, urgent care clinic since your last visit?  Hospitalized since your last visit?No    2. Have you seen or consulted any other health care providers outside of the [facility] since your last visit?  Include any pap smears or colon screening. No    ??    Health Maintenance reviewed - flu shot given, dtap prescription given. Pt updated on rest..    Requested Prescriptions      No prescriptions requested or ordered in this encounter       Visit Vitals  BP 101/53   Pulse 74   Temp 97.9 ??F (36.6 ??C) (Oral)   Resp 13   Ht 5\' 8"  (1.727 m)   Wt 134 lb 6.4 oz (61 kg)   SpO2 95%   BMI 20.44 kg/m??       ??  Upcoming Appts  Yes next month hematologist.  ??  VORB:   Orders Placed This Encounter   ??? Influenza virus vaccine (QUADRIVALENT PRES FREE SYRINGE) IM ([id number])    Nance Pew/Holmes, Bert W III, MD Bunnie Pion/Anjela Cassara, LPN

## 2017-10-09 NOTE — Patient Instructions (Signed)
Vaccine Information Statement    Influenza (Flu) Vaccine (Inactivated or Recombinant): What you need to know    Many Vaccine Information Statements are available in Spanish and other languages. See www.immunize.org/vis  Hojas de Informaci??n Sobre Vacunas est??n disponibles en Espa??ol y en muchos otros idiomas. Visite www.immunize.org/vis    1. Why get vaccinated?    Influenza (???flu???) is a contagious disease that spreads around the [place] every year, usually between October and May.     Flu is caused by influenza viruses, and is spread mainly by coughing, sneezing, and close contact.     Anyone can get flu. Flu strikes suddenly and can last several days. Symptoms vary by age, but can include:  ??? fever/chills  ??? sore throat  ??? muscle aches  ??? fatigue  ??? cough  ??? headache   ??? runny or stuffy nose    Flu can also lead to pneumonia and blood infections, and cause diarrhea and seizures in children.  If you have a medical condition, such as heart or lung disease, flu can make it worse.    Flu is more dangerous for some people. Infants and young children, people 65 years of age and older, pregnant women, and people with certain health conditions or a weakened immune system are at greatest risk.      Each year thousands of people in the [place] die from flu, and many more are hospitalized.     Flu vaccine can:  ??? keep you from getting flu,  ??? make flu less severe if you do get it, and  ??? keep you from spreading flu to your family and other people.     2. Inactivated and recombinant flu vaccines    A dose of flu vaccine is recommended every flu season. Children 6 months through 8 years of age may need two doses during the same flu season.  Everyone else needs only one dose each flu season.       Some inactivated flu vaccines contain a very small amount of a mercury-based preservative called thimerosal. Studies have not shown thimerosal in vaccines to be harmful, but flu vaccines that do not contain  thimerosal are available.    There is no live flu virus in flu shots.  They cannot cause the flu.     There are many flu viruses, and they are always changing. Each year a new flu vaccine is made to protect against three or four viruses that are likely to cause disease in the upcoming flu season. But even when the vaccine doesn???t exactly match these viruses, it may still provide some protection    Flu vaccine cannot prevent:  ??? flu that is caused by a virus not covered by the vaccine, or  ??? illnesses that look like flu but are not.    It takes about 2 weeks for protection to develop after vaccination, and protection lasts through the flu season.     3. Some people should not get this vaccine    Tell the person who is giving you the vaccine:    ??? If you have any severe, life-threatening allergies.    If you ever had a life-threatening allergic reaction after a dose of flu vaccine, or have a severe allergy to any part of this vaccine, you may be advised not to get vaccinated.  Most, but not all, types of flu vaccine contain a small amount of egg protein.       ??? If you   ever had Guillain-Barr?? Syndrome (also called GBS).   Some people with a history of GBS should not get this vaccine. This should be discussed with your doctor.    ??? If you are not feeling well.    It is usually okay to get flu vaccine when you have a mild illness, but you might be asked to come back when you feel better.      4. Risks of a vaccine reaction    With any medicine, including vaccines, there is a chance of reactions. These are usually mild and go away on their own, but serious reactions are also possible.     Most people who get a flu shot do not have any problems with it.     Minor problems following a flu shot include:   ??? soreness, redness, or swelling where the shot was given    ??? hoarseness  ??? sore, red or itchy eyes  ??? cough  ??? fever  ??? aches  ??? headache  ??? itching  ??? fatigue   If these problems occur, they usually begin soon after the shot and last 1 or 2 days.     More serious problems following a flu shot can include the following:    ??? There may be a small increased risk of Guillain-Barr?? Syndrome (GBS) after inactivated flu vaccine.  This risk has been estimated at 1 or 2 additional cases per million people vaccinated. This is much lower than the risk of severe complications from flu, which can be prevented by flu vaccine.      ??? Young children who get the flu shot along with pneumococcal vaccine (PCV13) and/or DTaP vaccine at the same time might be slightly more likely to have a seizure caused by fever. Ask your doctor for more information. Tell your doctor if a child who is getting flu vaccine has ever had a seizure.     Problems that could happen after any injected vaccine:     ??? People sometimes faint after a medical procedure, including vaccination. Sitting or lying down for about 15 minutes can help prevent fainting, and injuries caused by a fall. Tell your doctor if you feel dizzy, or have vision changes or ringing in the ears.    ??? Some people get severe pain in the shoulder and have difficulty moving the arm where a shot was given. This happens very rarely.    ??? Any medication can cause a severe allergic reaction. Such reactions from a vaccine are very rare, estimated at about 1 in a million doses, and would happen within a few minutes to a few hours after the vaccination.    As with any medicine, there is a very remote chance of a vaccine causing a serious injury or death.    The safety of vaccines is always being monitored. For more information, visit: www.cdc.gov/vaccinesafety/    5. What if there is a serious reaction?    What should I look for?    ??? Look for anything that concerns you, such as signs of a severe allergic reaction, very high fever, or unusual behavior.    Signs of a severe allergic reaction can include hives, swelling of the  face and throat, difficulty breathing, a fast heartbeat, dizziness, and weakness ??? usually within a few minutes to a few hours after the vaccination.    What should I do?    ??? If you think it is a severe allergic reaction or other emergency that   can???t wait, call 9-1-1 and get the person to the nearest hospital. Otherwise, call your doctor.    ??? Reactions should be reported to the Vaccine Adverse Event Reporting System (VAERS). Your doctor should file this report, or you can do it yourself through  the VAERS web site at www.vaers.hhs.gov, or by calling 1-800-822-7967.    VAERS does not give medical advice.    6. The National Vaccine Injury Compensation Program    The National Vaccine Injury Compensation Program (VICP) is a federal program that was created to compensate people who may have been injured by certain vaccines.    Persons who believe they may have been injured by a vaccine can learn about the program and about filing a claim by calling 1-800-338-2382 or visiting the VICP website at www.hrsa.gov/vaccinecompensation.  There is a time limit to file a claim for compensation.    7. How can I learn more?  ??? Ask your healthcare provider. He or she can give you the vaccine package insert or suggest other sources of information.  ??? Call your local or state health department.  ??? Contact the Centers for Disease Control and Prevention (CDC):  - Call 1-800-232-4636 (1-800-CDC-INFO) or  - Visit CDC???s website at www.cdc.gov/flu    Vaccine Information Statement   Inactivated Influenza Vaccine   07/22/2014  42 [place] ?? 300aa-[id number]    Department of Health and Human Services  Centers for Disease Control and Prevention    Office Use Only

## 2017-10-09 NOTE — Progress Notes (Signed)
Subjective:   Patient is a 31 y.o. year old male who presents for follow-up.    John Acevedo returns today for follow up. ??History is significant for sickle cell disease. He is taking hydroxyurea as prescribed.  He is followed by his sickle cell specialist Dr. Oretha Milch.  He occasionally experiences pain crises affecting his lower back and abdomen.  Most of the time his pain responds to ibuprofen, but he will occasionally need to take oxycodone-acetaminophen.  Pt reports no smoking for the past month.  He denies fever/chills, SOB, chest pain, n/v, abdominal pain, or back pain.     Pt reports being told that his urine dipstick performed during his employment physical was positive for blood 2 weeks ago.  Pt reports no gross hematuria, dysuria, flank pain, or suprapubic discomfort.  He states that he urinates more often when he drinks more fluids, but does not appreciate any urinary symptoms.     Review of Systems   Constitutional: Negative for chills, diaphoresis, fever, malaise/fatigue and weight loss.   Respiratory: Negative for cough and shortness of breath.    Cardiovascular: Negative for chest pain and leg swelling.   Gastrointestinal: Negative for abdominal pain, nausea and vomiting.   Genitourinary: Negative for dysuria, flank pain, frequency, hematuria and urgency.   Neurological: Negative for dizziness, weakness and headaches.       Current Outpatient Medications on File Prior to Visit   Medication Sig Dispense Refill   ??? ibuprofen (MOTRIN) 800 mg tablet TAKE 1 TABLET BY MOUTH EVERY 8 HOURS AS NEEDED FOR PAIN 60 Tab 0   ??? hydroxyurea (HYDREA) 500 mg capsule Take 1 Cap by mouth daily. 30 Cap 2     No current facility-administered medications on file prior to visit.        Reviewed PmHx, RxHx, FmHx, SocHx, AllgHx and updated and dated in the chart.    Nurse notes were reviewed and are correct    Objective:     Vitals:    10/09/17 1341   BP: 101/53   Pulse: 74   Resp: 13   Temp: 97.9 ??F (36.6 ??C)    TempSrc: Oral   SpO2: 95%   Weight: 134 lb 6.4 oz (61 kg)   Height: 5\' 8"  (1.727 m)     Physical Exam   Constitutional: He is oriented to person, place, and time. He appears well-developed and well-nourished.   HENT:   Head: Normocephalic and atraumatic.   Mouth/Throat: Oropharynx is clear and moist.   Eyes: Conjunctivae and EOM are normal. Pupils are equal, round, and reactive to light.   Neck: Normal range of motion. Neck supple.   Cardiovascular: Normal rate, regular rhythm, normal heart sounds and intact distal pulses.   Pulmonary/Chest: Effort normal and breath sounds normal.   Abdominal: Soft. Bowel sounds are normal. He exhibits no distension and no mass. There is no tenderness. There is no CVA tenderness. No hernia.   Musculoskeletal: Normal range of motion.   Lymphadenopathy:     He has no cervical adenopathy.   Neurological: He is alert and oriented to person, place, and time.   Skin: Skin is warm and dry.   Psychiatric: He has a normal mood and affect. His behavior is normal.   Nursing note and vitals reviewed.      Assessment/ Plan:     Diagnoses and all orders for this visit:    1. Abnormal urinalysis  -     AMB POC URINALYSIS DIP STICK AUTO  W/ MICRO  -     CULTURE, URINE; Future  -     URINALYSIS W/MICROSCOPIC; Future  -     METABOLIC PANEL, BASIC; Future    2. Sickle-cell disease with pain (HCC)  -     oxyCODONE-acetaminophen (PERCOCET) 5-325 mg per tablet; Take 1 Tab by mouth every eight (8) hours as needed for Pain. Max Daily Amount: 3 Tabs.  -     METABOLIC PANEL, BASIC; Future  -     CBC WITH AUTOMATED DIFF; Future  -     Follow up with Dr. Oretha Milchegina Crawford as scheduled.    3. Encounter for immunization  -     INFLUENZA VIRUS VAC QUAD,SPLIT,PRESV FREE SYRINGE IM       I have discussed the diagnosis with the patient and the intended plan as seen in the above orders.  The patient verbalized understanding and agrees with the plan.    Follow-up Disposition:   Return in about 3 weeks (around 10/30/2017) for routine follow-up, lab review.Nance Pew.    Tecora Eustache W Holmes III, MD

## 2017-10-11 LAB — CULTURE, URINE
RESULT: NORMAL
Result: NORMAL

## 2017-10-11 LAB — CBC WITH AUTOMATED DIFF
ABS. BASOPHILS: 0.1 10*3/uL (ref 0.0–0.2)
ABS. EOSINOPHILS: 0.2 10*3/uL (ref 0.0–0.5)
ABS. MONOCYTES: 0.8 10*3/uL (ref 0.1–1.0)
ABS. NEUTROPHILS: 4.5 10*3/uL (ref 1.8–7.7)
ABSOLUTE LYMPHOCYTE COUNT: 2.6 10*3/uL (ref 1.0–4.8)
BASOPHILS: 1 % (ref 0–2)
EOSINOPHILS: 2 % (ref 0–6)
HCT: 20.5 % — ABNORMAL LOW (ref 36.6–51.9)
HGB: 7.1 g/dL — ABNORMAL LOW (ref 13.2–17.3)
Lymphocytes: 32 % (ref 20–45)
MCH: 34 pg (ref 26–34)
MCHC: 35 g/dL (ref 31–36)
MCV: 98 fL — ABNORMAL HIGH (ref 80–95)
MONOCYTES: 10 % (ref 3–12)
MPV: 10.6 fL (ref 9.0–13.0)
NEUTROPHILS: 55 % (ref 40–75)
PLATELET: 214 10*3/uL (ref 140–440)
RBC: 2.1 M/uL — ABNORMAL LOW (ref 3.80–5.80)
RDW: 25.3 % — ABNORMAL HIGH (ref 10.0–15.5)
WBC: 8.1 10*3/uL (ref 4.0–11.0)

## 2017-10-11 LAB — REFLEX SLIDE REVIEW: SMEAR EVAL: NORMAL

## 2017-10-11 LAB — METABOLIC PANEL, BASIC
Anion gap: 27 mmol/L
BUN: 7 mg/dL (ref 6–22)
CO2: 15 mmol/L — ABNORMAL LOW (ref 20–32)
Calcium: 9.3 mg/dL (ref 8.4–10.4)
Chloride: 104 mmol/L (ref 98–110)
Creatinine: 0.5 mg/dL (ref 0.5–1.2)
GFRAA: >60 (ref >60.0)
GFRNA: >60 (ref >60.0)
Glucose: 82 mg/dL (ref 70–99)
Potassium: 4.6 mmol/L (ref 3.5–5.5)
Sodium: 146 mmol/L — ABNORMAL HIGH (ref 133–145)

## 2017-10-11 LAB — URINALYSIS W/MICROSCOPIC
Bilirubin: NEGATIVE
Glucose: NEGATIVE mg/dL
Ketone: NEGATIVE mg/dL
Leukocyte Esterase: NEGATIVE
Nitrites: NEGATIVE
Protein: NEGATIVE mg/dL
Specific Gravity: 1.011 (ref 1.005–1.03)
Urobilinogen: <2 mg/dL (ref <2.0)
WBC: NEGATIVE /hpf (ref 0–2)
pH (UA): 5 pH (ref 5.0–8.0)

## 2017-11-07 ENCOUNTER — Encounter

## 2017-11-07 MED ORDER — IBUPROFEN 800 MG TAB
800 mg | ORAL_TABLET | ORAL | 0 refills | Status: AC
Start: 2017-11-07 — End: ?

## 2017-11-20 ENCOUNTER — Ambulatory Visit
Admit: 2017-11-20 | Discharge: 2017-11-20 | Payer: PRIVATE HEALTH INSURANCE | Attending: Geriatric Medicine | Primary: Internal Medicine

## 2017-11-20 ENCOUNTER — Ambulatory Visit
Admit: 2017-11-20 | Discharge: 2017-11-20 | Payer: PRIVATE HEALTH INSURANCE | Attending: Internal Medicine | Primary: Internal Medicine

## 2017-11-20 ENCOUNTER — Encounter

## 2017-11-20 DIAGNOSIS — D571 Sickle-cell disease without crisis: Secondary | ICD-10-CM

## 2017-11-20 MED ORDER — OXYCODONE 5 MG TAB
5 mg | ORAL_TABLET | ORAL | 0 refills | Status: AC | PRN
Start: 2017-11-20 — End: ?

## 2017-11-20 MED ORDER — HYDROXYUREA 500 MG CAPSULE
500 mg | ORAL_CAPSULE | Freq: Every day | ORAL | 6 refills | Status: AC
Start: 2017-11-20 — End: ?

## 2017-11-20 NOTE — Progress Notes (Signed)
Subjective:   John Acevedo is a 31 y.o. male who presents for Hospital Follow Up (11/28 ed room for sickle cell crisis. )    John Acevedo presents today for follow up after ED visit for sickle cell pain crisis.  Pt [facility] ED [facility]/12/17 c/o chest and back pain characteristic of his prior pain episodes.  Vitals were stable.  EKG showed NSR without acute changes.  Portable CXR showed no active cadiopulmonary disease.  H/H was 8.7/22.9, reticulocyte count was 3.1.  BMP was wnl.  Pt received IV fluids and pain medication with improvement in symptoms.  Pt was discharged in stable condition with prescription for Norco and return precautions.  Today, patient reports feeling well.  He denies recurrence of pain crises, SOB, chest pain, back, or extremity pain.  He is compliant with hydroxyurea.  He has not followed up with his hematology/oncology/sick cell specialist in 6 months.  He has missed his last 2 scheduled appointments.  Pt reports appointments are difficult to keep due to specialist being located in [place].  He would like to find a closer specialist if possible.  He has no acute concerns at this time.    Review of Systems   Constitutional: Negative for chills, diaphoresis, fever, malaise/fatigue and weight loss.   HENT: Negative for sinus pain and sore throat.    Eyes: Negative for blurred vision.   Respiratory: Negative for cough and shortness of breath.    Cardiovascular: Negative for chest pain, palpitations, orthopnea, claudication, leg swelling and PND.   Gastrointestinal: Negative for abdominal pain, blood in stool, constipation, diarrhea, heartburn, melena, nausea and vomiting.   Genitourinary: Negative for dysuria, flank pain, frequency, hematuria and urgency.   Musculoskeletal: Negative for joint pain.   Neurological: Negative for dizziness, weakness and headaches.   Endo/Heme/Allergies: Does not bruise/bleed easily.        Current Outpatient Medications on File Prior to Visit   Medication Sig Dispense Refill   ??? ibuprofen (MOTRIN) 800 mg tablet TAKE 1 TABLET BY MOUTH EVERY 8 HOURS AS NEEDED FOR PAIN 60 Tab 0   ??? oxyCODONE-acetaminophen (PERCOCET) 5-325 mg per tablet Take 1 Tab by mouth every eight (8) hours as needed for Pain. Max Daily Amount: 3 Tabs. 15 Tab 0   ??? hydroxyurea (HYDREA) 500 mg capsule Take 1 Cap by mouth daily. 30 Cap 2     No current facility-administered medications on file prior to visit.        Reviewed PmHx, RxHx, FmHx, SocHx, AllgHx and updated and dated in the chart.    Nurse notes were reviewed and are correct    Objective:     Vitals:    11/20/17 1024   BP: 115/60   Pulse: 62   Resp: 13   Temp: 97 ??F (36.1 ??C)   TempSrc: Oral   SpO2: 90%   Weight: 136 lb 3.2 oz (61.8 kg)   Height: 5\' 8"  (1.727 m)     Physical Exam   Constitutional: He is oriented to person, place, and time. He appears well-developed and well-nourished.   HENT:   Head: Normocephalic and atraumatic.   Right Ear: External ear normal.   Left Ear: External ear normal.   Mouth/Throat: Oropharynx is clear and moist.   Eyes: Conjunctivae and EOM are normal. Pupils are equal, round, and reactive to light.   Neck: Normal range of motion. Neck supple.   Cardiovascular: Normal rate, regular rhythm, normal heart sounds and intact  distal pulses.   Pulmonary/Chest: Effort normal and breath sounds normal. No stridor. No respiratory distress. He has no wheezes. He has no rales. He exhibits no tenderness.   Abdominal: Soft. Bowel sounds are normal. He exhibits no distension and no mass. There is no tenderness.   Musculoskeletal: Normal range of motion. He exhibits no edema.   Neurological: He is alert and oriented to person, place, and time.   Skin: Skin is warm and dry. Capillary refill takes less than 2 seconds.   Psychiatric: He has a normal mood and affect.   Nursing note and vitals reviewed.      Assessment/ Plan:      Diagnoses and all orders for this visit:    1. Hb-SS disease without crisis (HCC)        -     Recent pain crisis now resolved.  Pt received evaluation and treatment in ED on 11/12/17.  Pain is controlled.  No SOB, chest pain, palpitations, or extremity pain.  Overdue for follow-up with hematology/sickle cell specialist.  Pt will be seen today for evaluation with John Acevedo.   -     REFERRAL TO HEMATOLOGY  -     CBC WITH AUTOMATED DIFF; Future  -     REFERRAL TO OPHTHALMOLOGY       I have discussed the diagnosis with the patient and the intended plan as seen in the above orders.  The patient verbalized understanding and agrees with the plan.    Follow-up Disposition: Not on File    John PewBert W Holmes III, MD

## 2017-11-20 NOTE — Addendum Note (Signed)
Addended by: Desmond LopeSISSOKO, Mialee Weyman on: 11/20/2017 04:27 PM     Modules accepted: Orders

## 2017-11-20 NOTE — Progress Notes (Signed)
[facility]  [address]  [place], [place] [address]  Office Phone: [phone]  Fax: [phone]    NEW HEME/ONC CONSULT      Reason for visit:  Sickle Cell Disease (New Patient)      HPI:   John Acevedo is a 31 y.o.  male past medical history including cigarette smoking but she stopped in November 2018, sickle cell disease (SS) who I was asked to see in consultation at the request of Dr. Aline August for evaluation for his sickle cell anemia.    DX: Sickle cell anemia    STAGE:  NA    ONCOLOGY HISTORY:     -Since childhood been followed with hematology at [facility] or [place].  But did not see hematologist in the last 6 months due to having moved.    -11/20/2017: First medical oncology visit with me        Past Medical History:   Diagnosis Date   ??? Sickle cell disease (HCC)      Past Surgical History:   Procedure Laterality Date   ??? HX CHOLECYSTECTOMY     ??? HX HERNIA REPAIR       Social History     Socioeconomic History   ??? Marital status: UNKNOWN     Spouse name: Not on file   ??? Number of children: Not on file   ??? Years of education: Not on file   ??? Highest education level: Not on file   Tobacco Use   ??? Smoking status: Current Every Day Smoker     Packs/day: 1.00     Years: 4.00     Pack years: 4.00   ??? Smokeless tobacco: Former Neurosurgeon   Substance and Sexual Activity   ??? Alcohol use: No   ??? Drug use: No   ??? Sexual activity: Yes     Family History   Problem Relation Age of Onset   ??? Diabetes Maternal Grandmother        Current Outpatient Medications   Medication Sig Dispense Refill   ??? oxyCODONE IR (ROXICODONE) 5 mg immediate release tablet Historical Medication     ??? ibuprofen (MOTRIN) 800 mg tablet TAKE 1 TABLET BY MOUTH EVERY 8 HOURS AS NEEDED FOR PAIN 60 Tab 0   ??? hydroxyurea (HYDREA) 500 mg capsule Take 1 Cap by mouth daily. 30 Cap 2   ??? diphenhydrAMINE-acetaminophen 25-500 mg tab Take 2 Tabs by Mouth nightly as needed.      ??? albuterol (PROVENTIL HFA) 90 mcg/actuation inhaler 2 Puffs.     ??? benzonatate (TESSALON) 100 mg capsule Historical Medication     ??? HYDROcodone-acetaminophen (NORCO) 5-325 mg per tablet TAKE 1 TABLET BY MOUTH EVERY 4 HOURS AS NEEDED  0   ??? pseudoephedrine (NASAL DECONGESTANT, PSEUDOEPH,) 30 mg tablet Historical Medication     ??? oxyCODONE-acetaminophen (PERCOCET) 5-325 mg per tablet Take 1 Tab by mouth every eight (8) hours as needed for Pain. Max Daily Amount: 3 Tabs. 15 Tab 0       No Known Allergies    Review of Systems  On review of systems today patient reports occasional headaches, and reports sickle cell pain chest, joints, low back, as well as swelling in her hands occasionally.  He denies any fever or chills.  No visible hematuria although he was told that he has microscopic hematuria.  No flank pain.  The remaining of the review of systems is negative.  Objective:  Physical Exam:  Visit Vitals  BP 109/65 (BP 1 Location: Left arm, BP Patient Position: Sitting)   Pulse 78   Temp 97.6 ??F (36.4 ??C) (Oral)   Resp 16   Wt 65.1 kg (143 lb 9.6 oz)   SpO2 92%   BMI 21.83 kg/m??       General:  Alert, cooperative, no distress, appears stated age.   Head:  Normocephalic, without obvious abnormality, atraumatic.   Eyes:  Conjunctivae/corneas clear. PERRL, EOMs intact.  No scleral jaundice.   Throat: Lips, mucosa, and tongue normal.    Neck: Supple, symmetrical, trachea midline, no adenopathy, thyroid: no enlargement/tenderness/nodules   Back:   Symmetric, no curvature. ROM normal. No CVA tenderness.   Lungs:   Clear to auscultation bilaterally.   Chest wall:  No tenderness or deformity.   Heart:  Regular rate and rhythm, S1, S2 normal, no murmur, click, rub or gallop.   Breast Exam:  No tenderness, masses, or nipple abnormality.    Abdomen:   Soft, non-tender. Bowel sounds normal. No masses,  No organomegaly.   Extremities: Extremities normal, atraumatic, no cyanosis or edema.  Has  mild clubbing of nails of fingers.has no ulcers   Skin: Skin color, texture, turgor normal. No rashes or lesions.   Lymph nodes: Cervical, supraclavicular, and axillary nodes normal.   Neurologic: CNII-XII intact.       Diagnostic Imaging     No results found for this or any previous visit.    Lab Results  Lab Results   Component Value Date/Time    WBC 8.1 10/09/2017 02:33 PM    HGB 7.1 (L) 10/09/2017 02:33 PM    HCT 20.5 (L) 10/09/2017 02:33 PM    PLATELET 214 10/09/2017 02:33 PM    MCV 98 (H) 10/09/2017 02:33 PM       Lab Results   Component Value Date/Time    Sodium 146 (H) 10/09/2017 02:33 PM    Potassium 4.6 10/09/2017 02:33 PM    Chloride 104 10/09/2017 02:33 PM    CO2 15 (L) 10/09/2017 02:33 PM    Anion gap 27.0 10/09/2017 02:33 PM    Glucose 82 10/09/2017 02:33 PM    BUN 7 10/09/2017 02:33 PM    Creatinine 0.5 10/09/2017 02:33 PM    Calcium 9.3 10/09/2017 02:33 PM     Assessment/Plan:  31 y.o. male with  1. Hb-SS disease without crisis [facility](HCC)  [facility] has not been seen in dermatology for the last 6 months because he has moved.  He has about 3 sickle cell crisis per year for cell pain has been controlled by Tylenol  And Oxycodone.  He is here today to establish with new hematologist since he is going to permanently stay in this area.  I had a long discussion with the patient and counseled him about staying well-hydrated, decreasing extremes of temperature exposure, and to take pain medication early when he feels  pain crisis is getting closer.  - Continue Tylenol and ibuprofen as needed.  - I will start prescribing opiates to him for his sickle cell pain since [facility] is not [facility] Dr. Okey Duprerawford previous hematologist anymore.  Pain contract was signed.  I will give him as previously but oxycodone 5 mg oral tabs take by mouth as needed for pain or combination of use Tylenol and ibuprofen.  I will dispense 30 pills.   -Continue Hydrea 500 mg daily.  I will consider adding Endari (L-glutamine) if pain crisis frequency worsens.    2. Macrocytic anemia  On 11/13/15, WBC was 1.3, H&H 8.7/22.9.  MCV 91.  Reticulocyte count 3.1.  Platelet 228.  Urinalysis showed moderate blood.  His macrocytosis is likely secondary to Hydrea.  Also it has normalized.  I insisted with patient to make sure he takes Hydrea.  We will check iron panel and ferritin today.    3. Asymptomatic microscopic hematuria  Disease likely secondary to his sickle cell.  Persist order ultrasound of the kidneys will send him to nephrology.    4. Sickle cell anemia with pain (HCC)  As above continue Tylenol, ibuprofen and oxycodone as needed.        Return in 3months unless needed earlier

## 2017-11-20 NOTE — Progress Notes (Signed)
John Acevedo is a 31 y.o.@ presents today for office visit for follow up. Pt is in Room # 4.    ??1. Have you been to the ER, urgent care clinic since your last visit?  Hospitalized since your last visit?Yes When: 11/28 sickle cell crisis    2. Have you seen or consulted any other health care providers outside of the [facility] since your last visit?  Include any pap smears or colon screening. No    ??    Health Maintenance reviewed - pt declined dtap.Marland Kitchen.    Requested Prescriptions      No prescriptions requested or ordered in this encounter       Visit Vitals  BP 115/60 (BP 1 Location: Right arm, BP Patient Position: Sitting)   Pulse 62   Temp 97 ??F (36.1 ??C) (Oral)   Resp 13   Ht 5\' 8"  (1.727 m)   Wt 136 lb 3.2 oz (61.8 kg)   SpO2 90%   BMI 20.71 kg/m??       ??  Upcoming Appts  No.  ??  VORB: No orders of the defined types were placed in this encounter.   Nance Pew/Holmes, Bert W III, MD Bunnie Pion/Andyn Sales, LPN

## 2017-11-20 NOTE — Patient Instructions (Signed)
Pt instructed to follow-up with Hematology as scheduled.

## 2017-11-20 NOTE — Progress Notes (Signed)
John Acevedo is a 31 y.o. male presenting today for a new patient appointment r/t sickle cell disease. Patient is ambulatory with no assistive devices and reports pain of 3/10 in lower back.    Chief Complaint   Patient presents with   ??? Sickle Cell Disease     New Patient       Visit Vitals  BP 109/65 (BP 1 Location: Left arm, BP Patient Position: Sitting)   Pulse 78   Temp 97.6 ??F (36.4 ??C) (Oral)   Resp 16   Wt 65.1 kg (143 lb 9.6 oz)   SpO2 92%   BMI 21.83 kg/m??       Current Outpatient Medications   Medication Sig   ??? oxyCODONE IR (ROXICODONE) 5 mg immediate release tablet Historical Medication   ??? ibuprofen (MOTRIN) 800 mg tablet TAKE 1 TABLET BY MOUTH EVERY 8 HOURS AS NEEDED FOR PAIN   ??? hydroxyurea (HYDREA) 500 mg capsule Take 1 Cap by mouth daily.   ??? diphenhydrAMINE-acetaminophen 25-500 mg tab Take 2 Tabs by Mouth nightly as needed.   ??? albuterol (PROVENTIL HFA) 90 mcg/actuation inhaler 2 Puffs.   ??? benzonatate (TESSALON) 100 mg capsule Historical Medication   ??? HYDROcodone-acetaminophen (NORCO) 5-325 mg per tablet TAKE 1 TABLET BY MOUTH EVERY 4 HOURS AS NEEDED   ??? pseudoephedrine (NASAL DECONGESTANT, PSEUDOEPH,) 30 mg tablet Historical Medication   ??? oxyCODONE-acetaminophen (PERCOCET) 5-325 mg per tablet Take 1 Tab by mouth every eight (8) hours as needed for Pain. Max Daily Amount: 3 Tabs.     No current facility-administered medications for this visit.        Medications no longer taking/discontinued: albuterol, Tessalon, diphenhydramine-acetaminophen, Norco 5-325mg , Percocet 5-325mg , pseudoephedrine    No flowsheet data found.    PHQ over the last two weeks 11/22/2016   Little interest or pleasure in doing things Not at all   Feeling down, depressed, irritable, or hopeless Not at all   Total Score PHQ 2 0       No flowsheet data found.    Learning Assessment 08/12/2016   PRIMARY LEARNER Patient   HIGHEST LEVEL OF EDUCATION - PRIMARY LEARNER  SOME COLLEGE   BARRIERS PRIMARY LEARNER NONE    CO-LEARNER CAREGIVER No   PRIMARY LANGUAGE ENGLISH   LEARNER PREFERENCE PRIMARY LISTENING   ANSWERED BY self   RELATIONSHIP SELF

## 2018-02-02 ENCOUNTER — Encounter: Attending: Internal Medicine | Primary: Internal Medicine

## 2018-02-12 ENCOUNTER — Encounter: Payer: MEDICAID | Primary: Internal Medicine

## 2018-02-18 ENCOUNTER — Encounter: Attending: Geriatric Medicine | Primary: Internal Medicine

## 2018-02-18 ENCOUNTER — Encounter: Attending: Internal Medicine | Primary: Internal Medicine

## 2018-02-18 NOTE — Telephone Encounter (Signed)
Left VM in regards to patients appointment on 03/07/25018. I requested that patient call back to r/s and to have labs completed before visit.

## 2018-02-19 ENCOUNTER — Encounter: Attending: Geriatric Medicine | Primary: Internal Medicine

## 2018-07-19 ENCOUNTER — Encounter
Admit: 2018-07-19 | Discharge: 2018-07-20 | Disposition: A | Discharge status: Home / Self Care | Payer: PRIVATE HEALTH INSURANCE | Attending: Emergency Medicine

## 2018-07-19 DIAGNOSIS — D5700 Hb-SS disease with crisis, unspecified: Principal | ICD-10-CM

## 2018-07-20 DIAGNOSIS — D5700 Hb-SS disease with crisis, unspecified: Principal | ICD-10-CM

## 2018-08-28 ENCOUNTER — Other Ambulatory Visit: Payer: Self-pay

## 2018-08-28 ENCOUNTER — Encounter: Payer: Self-pay | Admitting: *Deleted

## 2018-08-28 ENCOUNTER — Emergency Department: Payer: Medicaid - Out of State

## 2018-08-28 ENCOUNTER — Emergency Department
Admission: EM | Admit: 2018-08-28 | Discharge: 2018-08-28 | Disposition: A | Discharge status: Home / Self Care | Payer: Medicaid - Out of State | Attending: Emergency Medicine | Admitting: Emergency Medicine

## 2018-08-28 DIAGNOSIS — F17200 Nicotine dependence, unspecified, uncomplicated: Secondary | ICD-10-CM | POA: Insufficient documentation

## 2018-08-28 DIAGNOSIS — D5700 Hb-SS disease with crisis, unspecified: Secondary | ICD-10-CM | POA: Diagnosis not present

## 2018-08-28 DIAGNOSIS — R079 Chest pain, unspecified: Secondary | ICD-10-CM | POA: Diagnosis present

## 2018-08-28 HISTORY — DX: Sickle-cell disease without crisis: D57.1

## 2018-08-28 LAB — CBC WITH DIFFERENTIAL/PLATELET
BAND NEUTROPHILS: 0 %
BASOS ABS: 0 10*3/uL (ref 0–0.1)
BASOS PCT: 0 %
Blasts: 0 %
EOS ABS: 0 10*3/uL (ref 0–0.7)
EOS PCT: 0 %
HCT: 22.5 % — ABNORMAL LOW (ref 40.0–52.0)
Hemoglobin: 8.2 g/dL — ABNORMAL LOW (ref 13.0–18.0)
LYMPHS ABS: 3.3 10*3/uL (ref 1.0–3.6)
Lymphocytes Relative: 27 %
MCH: 34.7 pg — ABNORMAL HIGH (ref 26.0–34.0)
MCHC: 36.4 g/dL — AB (ref 32.0–36.0)
MCV: 95.3 fL (ref 80.0–100.0)
METAMYELOCYTES PCT: 0 %
MONOS PCT: 9 %
Monocytes Absolute: 1.1 10*3/uL — ABNORMAL HIGH (ref 0.2–1.0)
Myelocytes: 0 %
NEUTROS ABS: 7.7 10*3/uL — AB (ref 1.4–6.5)
Neutrophils Relative %: 64 %
OTHER: 0 %
PLATELETS: 192 10*3/uL (ref 150–440)
Promyelocytes Relative: 0 %
RBC: 2.37 MIL/uL — ABNORMAL LOW (ref 4.40–5.90)
RDW: 24.6 % — AB (ref 11.5–14.5)
WBC: 12.1 10*3/uL — ABNORMAL HIGH (ref 3.8–10.6)
nRBC: 2 /100 WBC — ABNORMAL HIGH

## 2018-08-28 LAB — COMPREHENSIVE METABOLIC PANEL
ALBUMIN: 4.3 g/dL (ref 3.5–5.0)
ALT: 25 U/L (ref 0–44)
AST: 44 U/L — ABNORMAL HIGH (ref 15–41)
Alkaline Phosphatase: 54 U/L (ref 38–126)
Anion gap: 7 (ref 5–15)
BUN: 8 mg/dL (ref 6–20)
CHLORIDE: 106 mmol/L (ref 98–111)
CO2: 25 mmol/L (ref 22–32)
Calcium: 9.2 mg/dL (ref 8.9–10.3)
Creatinine, Ser: 0.58 mg/dL — ABNORMAL LOW (ref 0.61–1.24)
GFR calc Af Amer: >60 mL/min (ref >60)
GFR calc non Af Amer: >60 mL/min (ref >60)
GLUCOSE: 124 mg/dL — AB (ref 70–99)
POTASSIUM: 3.9 mmol/L (ref 3.5–5.1)
SODIUM: 138 mmol/L (ref 135–145)
Total Bilirubin: 2.2 mg/dL — ABNORMAL HIGH (ref 0.3–1.2)
Total Protein: 8 g/dL (ref 6.5–8.1)

## 2018-08-28 LAB — RETICULOCYTES
RBC.: 2.37 MIL/uL — AB (ref 4.40–5.90)
RETIC COUNT ABSOLUTE: 182.5 10*3/uL (ref 19.0–183.0)
Retic Ct Pct: 7.7 % — ABNORMAL HIGH (ref 0.4–3.1)

## 2018-08-28 LAB — TROPONIN I: Troponin I: <0.03 ng/mL (ref <0.03)

## 2018-08-28 MED ORDER — SODIUM CHLORIDE 0.9 % IV BOLUS
1000.0000 mL | Freq: Once | INTRAVENOUS | Status: AC
  Administered 2018-08-28: 1000 mL via INTRAVENOUS

## 2018-08-28 MED ORDER — HYDROMORPHONE HCL 1 MG/ML IJ SOLN
1.0000 mg | Freq: Once | INTRAMUSCULAR | Status: AC
  Administered 2018-08-28: 1 mg via INTRAVENOUS
  Filled 2018-08-28: qty 1

## 2018-08-28 MED ORDER — ONDANSETRON HCL 4 MG/2ML IJ SOLN
4.0000 mg | Freq: Once | INTRAMUSCULAR | Status: AC
  Administered 2018-08-28: 4 mg via INTRAVENOUS
  Filled 2018-08-28: qty 2

## 2018-08-28 MED ORDER — OXYCODONE-ACETAMINOPHEN 5-325 MG PO TABS: 1.0000 | ORAL_TABLET | ORAL | 0 refills | Status: AC | PRN

## 2018-08-28 NOTE — ED Provider Notes (Signed)
[facility] Emergency Department Provider Note  Time seen: 12:26 PM  I have reviewed the triage vital signs and the nursing notes.   HISTORY  Chief Complaint Sickle Cell Mcgee Crisis and Chest Mcgee    HPI Benjamin Mcgee is a 32 y.o. male with a past medical history of sickle cell disease presents to the emergency department for diffuse Mcgee.  According to the patient he woke this morning with Mcgee in all of his extremities, as well as in his chest.  Denies any cough or congestion.  Denies any fever.  Patient states this feels typical of his sickle cell crisis.  He is from [place], was taken hydroxyurea and Percocet at home for Mcgee.  States he is out of Percocet, is currently attempting to arrange follow-up with a local hematologist, as they recently moved to this area.  Describes the Mcgee as moderate to severe aching Mcgee in all extremities and central chest.   Past Medical History:  Diagnosis Date  . Sickle cell anemia (HCC)     There are no active problems to display for this patient.   Past Surgical History:  Procedure Laterality Date  . CHOLECYSTECTOMY    . HERNIA REPAIR      Prior to Admission medications   Not on File    Not on File  History reviewed. No pertinent family history.  Social History Social History   Tobacco Use  . Smoking status: Current Every Day Smoker  . Smokeless tobacco: Never Used  Substance Use Topics  . Alcohol use: Yes  . Drug use: Never    Review of Systems Constitutional: Negative for fever. Cardiovascular: Positive for central chest Mcgee Respiratory: Negative for shortness of breath.  Negative for cough Gastrointestinal: Negative for abdominal Mcgee, vomiting  Genitourinary: Negative for urinary compaints Musculoskeletal: Mcgee in all extremities Skin: Negative for skin complaints  Neurological: Negative for headache All other ROS negative  ____________________________________________   PHYSICAL  EXAM:  VITAL SIGNS: ED Triage Vitals [08/28/18 1136]  Enc Vitals Group     BP 118/71     Pulse Rate 82     Resp 16     Temp 98.1 F (36.7 C)     Temp Source Oral     SpO2 93 %     Weight 135 lb (61.2 kg)     Height 5\' 8"  (1.727 m)     Head Circumference      Peak Flow      Mcgee Score 7     Mcgee Loc      Mcgee Edu?      Excl. in GC?    Constitutional: Alert and oriented. Well appearing and in no distress. Eyes: Normal exam ENT   Head: Normocephalic and atraumatic.   Mouth/Throat: Mucous membranes are moist. Cardiovascular: Normal rate, regular rhythm. No murmur Respiratory: Normal respiratory effort without tachypnea nor retractions. Breath sounds are clear.  Mild tenderness to palpation across the sternum. Gastrointestinal: Soft, slight epigastric tenderness.  No rebound guarding or distention Musculoskeletal: Mild tenderness to palpation of lower extremities and upper extremities throughout muscles.  No lower extremity edema. Neurologic:  Normal speech and language. No gross focal neurologic deficits  Skin:  Skin is warm, dry and intact.  Psychiatric: Mood and affect are normal.   ____________________________________________    RADIOLOGY  Chest x-ray is clear.  ____________________________________________   INITIAL IMPRESSION / ASSESSMENT AND PLAN / ED COURSE  Pertinent labs & imaging results that were available during my  care of the patient were reviewed by me and considered in my medical decision making (see chart for details).  Patient presents to the emergency department for diffuse body pains including Mcgee in the chest.  States it feels typical of his sickle cell crises.  States Mcgee started this morning.  We will dose IV Mcgee medication, IV fluids and placed on nasal cannula oxygen.  We will check labs including reticulocyte cell count as well as cardiac enzymes.  We will obtain a chest x-ray and continue to closely monitor.  Chest x-ray negative for  acute finding.  Do not suspect acute chest syndrome based on clinical presentation vitals and chest x-ray.  Labs are pending.  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   Patient's labs today are largely at baseline.  Chest x-ray is clear.  Troponin negative.  Patient states his Mcgee is much improved after 2 rounds of IV Dilaudid.  We will discharge with a short course of Percocet.  Patient is currently attempting to establish care with a local hematologist.  I did discuss return precautions.   ____________________________________________   FINAL CLINICAL IMPRESSION(S) / ED DIAGNOSES  Sickle cell Mcgee crisis    Minna Antis, MD 08/28/18 1445

## 2018-08-28 NOTE — ED Triage Notes (Signed)
First Nurse Note:  Arrives with C/O "sickle cell crisis".  States chest and back pain began this morning at around 0400.  Took Ibuprofen, with no relief.  AAOx3.  Skin warm and dry. No SOB/ DOE.  NAD

## 2018-08-28 NOTE — ED Triage Notes (Signed)
Pt reporting chest pain and generalized body aches that started at 4:00 this morning. Pt reports a hx of sickle cell and similar pains with past crisis. Generalized body aches, SOB noted.

## 2018-09-01 ENCOUNTER — Ambulatory Visit
Admit: 2018-09-01 | Discharge: 2018-09-03 | Disposition: A | Discharge status: Home / Self Care | Payer: PRIVATE HEALTH INSURANCE

## 2018-09-01 DIAGNOSIS — D5700 Hb-SS disease with crisis, unspecified: Principal | ICD-10-CM

## 2018-09-03 MED ORDER — OXYCODONE 5 MG TABLET
ORAL_TABLET | Freq: Four times a day (QID) | ORAL | 0 refills | 0 days | Status: CP | PRN
Start: 2018-09-03 — End: 2019-01-28

## 2018-09-03 MED ORDER — HYDROXYUREA 500 MG CAPSULE
ORAL_CAPSULE | Freq: Every day | ORAL | 2 refills | 0.00000 days | Status: CP
Start: 2018-09-03 — End: 2018-10-03

## 2018-09-03 MED ORDER — IBUPROFEN 600 MG TABLET
Freq: Three times a day (TID) | ORAL | 0 refills | 0 days
Start: 2018-09-03 — End: 2019-08-16

## 2018-09-03 MED ORDER — ACETAMINOPHEN 500 MG TABLET
ORAL_TABLET | Freq: Three times a day (TID) | ORAL | 0 refills | 0 days
Start: 2018-09-03 — End: ?

## 2018-10-08 ENCOUNTER — Encounter
Admit: 2018-10-08 | Discharge: 2018-10-09 | Payer: PRIVATE HEALTH INSURANCE | Attending: Hematology | Primary: Hematology

## 2018-10-08 DIAGNOSIS — D571 Sickle-cell disease without crisis: Secondary | ICD-10-CM

## 2018-10-08 DIAGNOSIS — D5700 Hb-SS disease with crisis, unspecified: Secondary | ICD-10-CM

## 2018-10-08 MED ORDER — OXYCODONE 5 MG TABLET
ORAL_TABLET | ORAL | 0 refills | 0 days | Status: CP | PRN
Start: 2018-10-08 — End: 2018-11-03

## 2018-10-08 MED ORDER — HYDROXYUREA 500 MG CAPSULE
ORAL_CAPSULE | 1 refills | 0 days | Status: CP
Start: 2018-10-08 — End: 2018-12-08

## 2018-11-05 MED ORDER — OXYCODONE 5 MG TABLET
ORAL_TABLET | ORAL | 0 refills | 0 days | Status: CP | PRN
Start: 2018-11-05 — End: 2018-11-26

## 2018-11-06 ENCOUNTER — Encounter: Admit: 2018-11-06 | Discharge: 2018-11-07 | Payer: PRIVATE HEALTH INSURANCE

## 2018-11-06 DIAGNOSIS — D571 Sickle-cell disease without crisis: Principal | ICD-10-CM

## 2018-11-26 ENCOUNTER — Encounter: Admit: 2018-11-26 | Discharge: 2018-11-27 | Payer: PRIVATE HEALTH INSURANCE

## 2018-11-26 ENCOUNTER — Encounter
Admit: 2018-11-26 | Discharge: 2018-11-27 | Payer: PRIVATE HEALTH INSURANCE | Attending: Hematology | Primary: Hematology

## 2018-11-26 DIAGNOSIS — D571 Sickle-cell disease without crisis: Secondary | ICD-10-CM

## 2018-11-26 DIAGNOSIS — R6889 Other general symptoms and signs: Secondary | ICD-10-CM

## 2018-11-26 DIAGNOSIS — R0609 Other forms of dyspnea: Principal | ICD-10-CM

## 2018-11-26 DIAGNOSIS — D5700 Hb-SS disease with crisis, unspecified: Secondary | ICD-10-CM

## 2018-11-26 MED ORDER — AMITRIPTYLINE 25 MG TABLET
ORAL_TABLET | Freq: Every evening | ORAL | 3 refills | 0 days | Status: CP
Start: 2018-11-26 — End: 2018-11-26

## 2018-11-26 MED ORDER — AMITRIPTYLINE 10 MG TABLET
ORAL_TABLET | Freq: Every evening | ORAL | 3 refills | 0.00000 days | Status: CP
Start: 2018-11-26 — End: 2019-01-27

## 2018-11-26 MED ORDER — OXYCODONE 5 MG TABLET
ORAL_TABLET | ORAL | 0 refills | 0.00000 days | Status: CP | PRN
Start: 2018-11-26 — End: 2018-11-26

## 2018-12-03 MED ORDER — OXYCODONE 5 MG TABLET
ORAL_TABLET | ORAL | 0 refills | 0.00000 days | Status: CP | PRN
Start: 2018-12-03 — End: 2019-01-27

## 2018-12-08 MED ORDER — HYDROXYUREA 500 MG CAPSULE
ORAL_CAPSULE | 1 refills | 0 days | Status: CP
Start: 2018-12-08 — End: 2019-02-09

## 2018-12-30 ENCOUNTER — Encounter: Admit: 2018-12-30 | Discharge: 2018-12-31 | Payer: PRIVATE HEALTH INSURANCE

## 2018-12-30 DIAGNOSIS — D571 Sickle-cell disease without crisis: Principal | ICD-10-CM

## 2019-01-01 DIAGNOSIS — D5700 Hb-SS disease with crisis, unspecified: Principal | ICD-10-CM

## 2019-01-02 ENCOUNTER — Encounter
Admit: 2019-01-02 | Discharge: 2019-01-02 | Disposition: A | Discharge status: Home / Self Care | Payer: PRIVATE HEALTH INSURANCE

## 2019-01-03 MED ORDER — OXYCODONE 5 MG TABLET
ORAL_TABLET | ORAL | 0 refills | 0 days | Status: CP | PRN
Start: 2019-01-03 — End: 2019-01-30

## 2019-01-27 ENCOUNTER — Encounter
Admit: 2019-01-27 | Discharge: 2019-01-28 | Payer: PRIVATE HEALTH INSURANCE | Attending: Hematology | Primary: Hematology

## 2019-01-27 DIAGNOSIS — R809 Proteinuria, unspecified: Secondary | ICD-10-CM

## 2019-01-27 DIAGNOSIS — D571 Sickle-cell disease without crisis: Secondary | ICD-10-CM

## 2019-01-27 DIAGNOSIS — R6889 Other general symptoms and signs: Secondary | ICD-10-CM

## 2019-01-27 MED ORDER — AMITRIPTYLINE 10 MG TABLET
ORAL_TABLET | Freq: Every evening | ORAL | 3 refills | 0.00000 days | Status: CP
Start: 2019-01-27 — End: 2019-05-26

## 2019-01-28 MED ORDER — OXYCODONE 5 MG TABLET
ORAL_TABLET | ORAL | 0 refills | 0 days | Status: CP | PRN
Start: 2019-01-28 — End: 2019-02-25

## 2019-02-02 DIAGNOSIS — R51 Headache: Principal | ICD-10-CM

## 2019-02-03 ENCOUNTER — Encounter
Admit: 2019-02-03 | Discharge: 2019-02-03 | Disposition: A | Discharge status: Home / Self Care | Payer: PRIVATE HEALTH INSURANCE

## 2019-02-09 MED ORDER — HYDROXYUREA 500 MG CAPSULE
ORAL_CAPSULE | 1 refills | 0 days | Status: CP
Start: 2019-02-09 — End: 2019-04-12

## 2019-02-25 MED ORDER — OXYCODONE 5 MG TABLET
ORAL_TABLET | ORAL | 0 refills | 0 days | Status: CP | PRN
Start: 2019-02-25 — End: 2019-03-18

## 2019-03-08 ENCOUNTER — Ambulatory Visit: Admit: 2019-03-08 | Discharge: 2019-03-09 | Payer: PRIVATE HEALTH INSURANCE

## 2019-03-08 DIAGNOSIS — D571 Sickle-cell disease without crisis: Principal | ICD-10-CM

## 2019-03-27 MED ORDER — OXYCODONE 5 MG TABLET
ORAL_TABLET | ORAL | 0 refills | 0.00000 days | Status: CP | PRN
Start: 2019-03-27 — End: 2019-04-27

## 2019-04-12 MED ORDER — HYDROXYUREA 500 MG CAPSULE
ORAL_CAPSULE | 1 refills | 0 days | Status: CP
Start: 2019-04-12 — End: 2019-05-31

## 2019-04-23 MED ORDER — FOLIC ACID 1 MG TABLET
ORAL_TABLET | Freq: Every day | ORAL | 3 refills | 0 days | Status: CP
Start: 2019-04-23 — End: 2020-04-22

## 2019-04-26 ENCOUNTER — Ambulatory Visit: Admit: 2019-04-26 | Discharge: 2019-04-27 | Payer: MEDICAID

## 2019-04-26 DIAGNOSIS — D571 Sickle-cell disease without crisis: Principal | ICD-10-CM

## 2019-04-27 MED ORDER — OXYCODONE 5 MG TABLET
ORAL_TABLET | ORAL | 0 refills | 0 days | Status: CP | PRN
Start: 2019-04-27 — End: 2019-05-20

## 2019-05-18 ENCOUNTER — Encounter: Admit: 2019-05-18 | Discharge: 2019-05-19 | Payer: PRIVATE HEALTH INSURANCE

## 2019-05-18 DIAGNOSIS — D571 Sickle-cell disease without crisis: Principal | ICD-10-CM

## 2019-05-20 ENCOUNTER — Encounter
Admit: 2019-05-20 | Discharge: 2019-05-21 | Payer: PRIVATE HEALTH INSURANCE | Attending: Hematology | Primary: Hematology

## 2019-05-20 DIAGNOSIS — D5700 Hb-SS disease with crisis, unspecified: Principal | ICD-10-CM

## 2019-05-26 MED ORDER — AMITRIPTYLINE 10 MG TABLET
ORAL_TABLET | Freq: Every evening | ORAL | 3 refills | 0 days | Status: CP
Start: 2019-05-26 — End: 2020-05-25

## 2019-05-28 MED ORDER — OXYCODONE 5 MG TABLET
ORAL_TABLET | ORAL | 0 refills | 0 days | Status: CP | PRN
Start: 2019-05-28 — End: 2019-06-29

## 2019-05-31 MED ORDER — HYDROXYUREA 500 MG CAPSULE
ORAL_CAPSULE | 1 refills | 0 days | Status: CP
Start: 2019-05-31 — End: 2019-07-27

## 2019-06-12 IMAGING — DX DG CHEST 1V PORT
1 series · 1 of 1 positions shown · non-contrast
Comparison: None.

CLINICAL DATA: Chest pain and shortness of breath. History of
sickle cell disease

EXAM:
PORTABLE CHEST 1 VIEW

[chest ap]
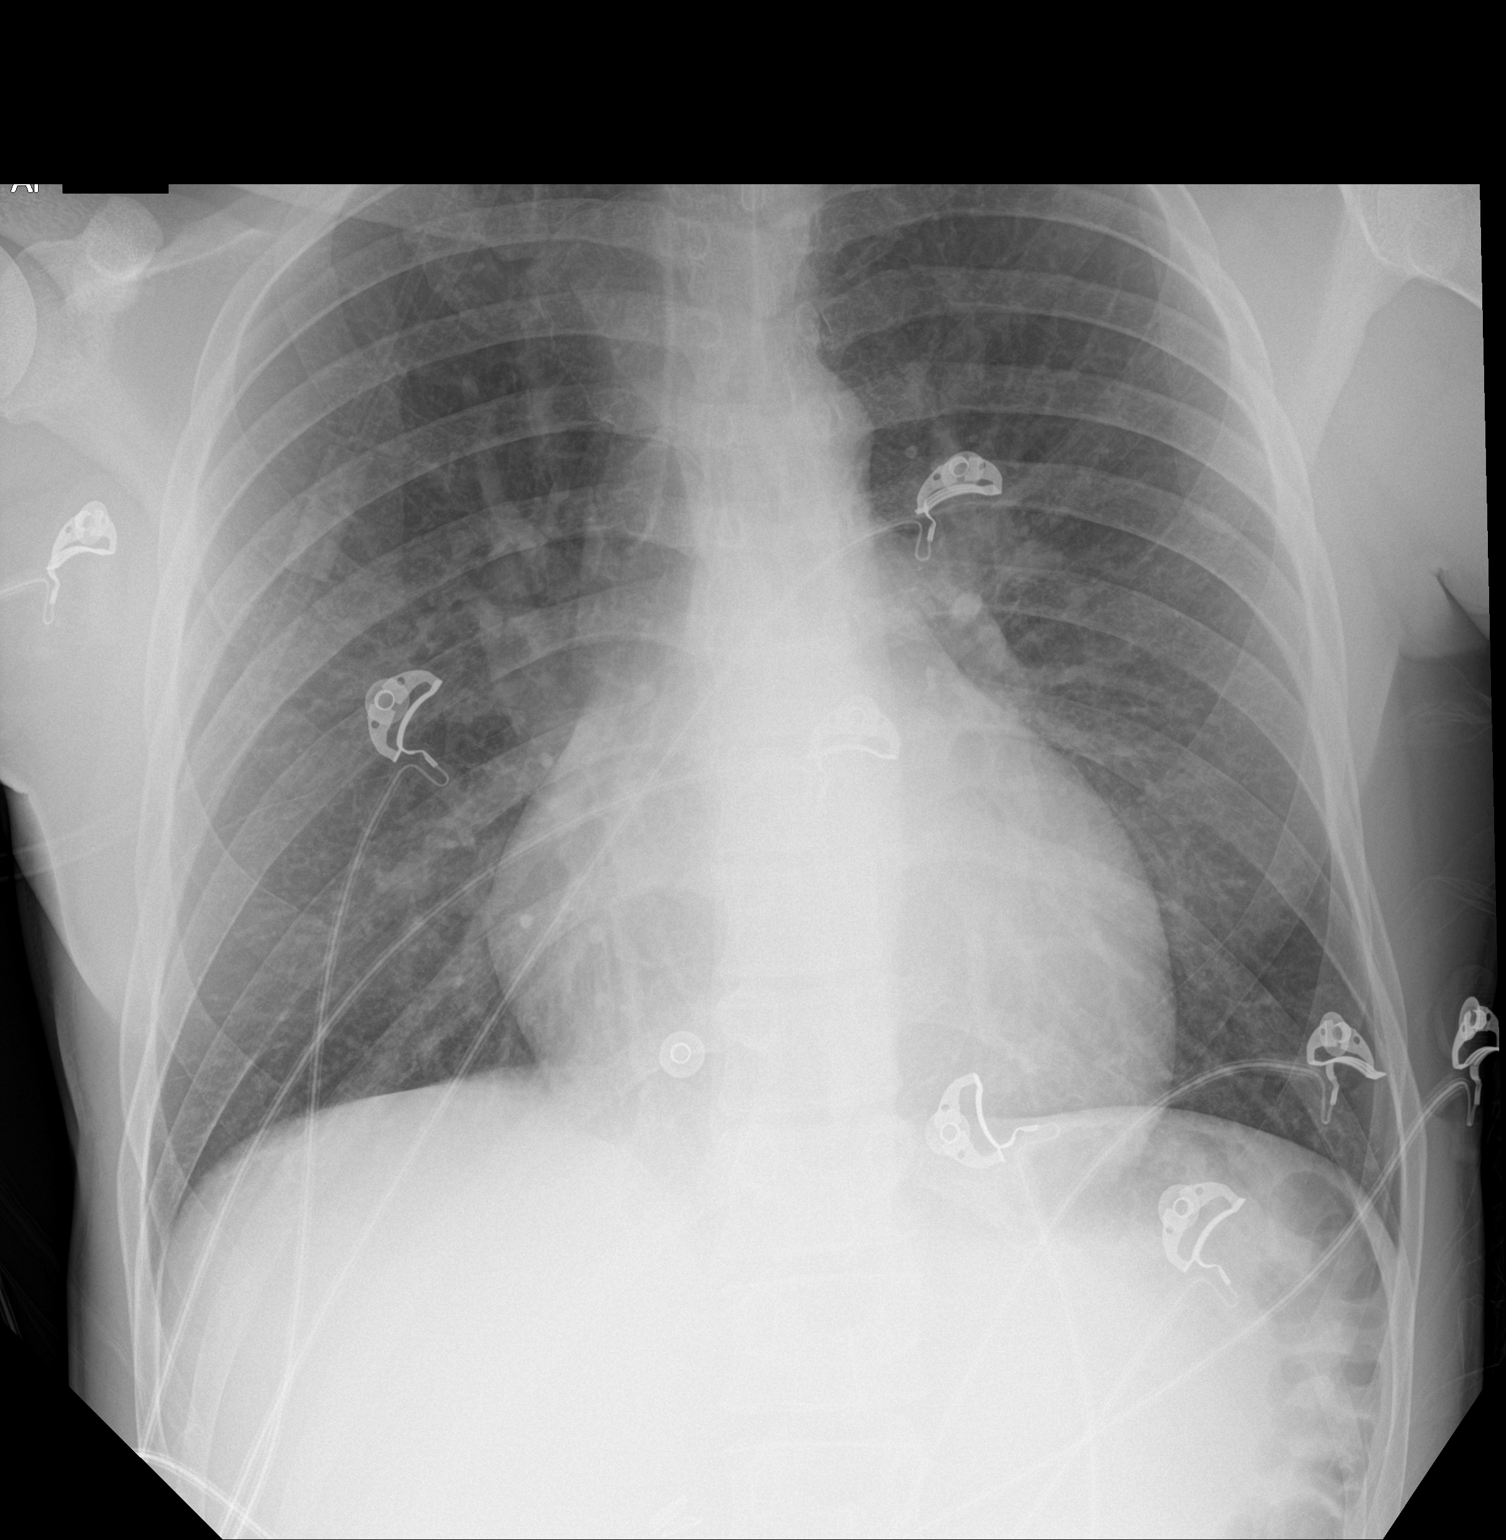

[1 of 1 positions shown; findings below may reference images not displayed]

FINDINGS: Lungs are clear. Heart is enlarged with pulmonary vascularity
normal. No adenopathy. No bone lesions appreciable.
IMPRESSION: Cardiomegaly.  No edema or consolidation.  No adenopathy evident.

## 2019-06-29 MED ORDER — OXYCODONE 5 MG TABLET
ORAL_TABLET | ORAL | 0 refills | 5 days | Status: CP | PRN
Start: 2019-06-29 — End: 2019-07-27

## 2019-07-08 ENCOUNTER — Telehealth
Admit: 2019-07-08 | Discharge: 2019-07-09 | Payer: PRIVATE HEALTH INSURANCE | Attending: Hematology | Primary: Hematology

## 2019-07-08 DIAGNOSIS — D571 Sickle-cell disease without crisis: Principal | ICD-10-CM

## 2019-07-20 ENCOUNTER — Encounter: Admit: 2019-07-20 | Discharge: 2019-07-21 | Payer: PRIVATE HEALTH INSURANCE

## 2019-07-20 DIAGNOSIS — D571 Sickle-cell disease without crisis: Principal | ICD-10-CM

## 2019-07-27 MED ORDER — HYDROXYUREA 500 MG CAPSULE
ORAL_CAPSULE | 1 refills | 0 days | Status: CP
Start: 2019-07-27 — End: ?

## 2019-07-27 MED ORDER — OXYCODONE 5 MG TABLET
ORAL_TABLET | ORAL | 0 refills | 5 days | Status: CP | PRN
Start: 2019-07-27 — End: 2019-08-04

## 2019-08-04 ENCOUNTER — Encounter
Admit: 2019-08-04 | Discharge: 2019-08-05 | Payer: PRIVATE HEALTH INSURANCE | Attending: Hematology | Primary: Hematology

## 2019-08-04 DIAGNOSIS — G4734 Idiopathic sleep related nonobstructive alveolar hypoventilation: Secondary | ICD-10-CM

## 2019-08-04 DIAGNOSIS — D5700 Hb-SS disease with crisis, unspecified: Principal | ICD-10-CM

## 2019-08-04 MED ORDER — OXYCODONE 5 MG TABLET
ORAL_TABLET | ORAL | 0 refills | 4 days | Status: CP | PRN
Start: 2019-08-04 — End: 2019-08-16

## 2019-08-08 ENCOUNTER — Encounter
Admit: 2019-08-08 | Discharge: 2019-08-08 | Disposition: A | Discharge status: Home / Self Care | Payer: PRIVATE HEALTH INSURANCE

## 2019-08-13 ENCOUNTER — Encounter: Admit: 2019-08-13 | Discharge: 2019-08-14 | Payer: PRIVATE HEALTH INSURANCE

## 2019-08-13 ENCOUNTER — Encounter: Admit: 2019-08-13 | Discharge: 2019-08-14 | Payer: MEDICAID

## 2019-08-13 DIAGNOSIS — K029 Dental caries, unspecified: Secondary | ICD-10-CM

## 2019-08-13 DIAGNOSIS — S62366D Nondisplaced fracture of neck of fifth metacarpal bone, right hand, subsequent encounter for fracture with routine healing: Principal | ICD-10-CM

## 2019-08-13 DIAGNOSIS — G4734 Idiopathic sleep related nonobstructive alveolar hypoventilation: Secondary | ICD-10-CM

## 2019-08-13 DIAGNOSIS — S62336A Displaced fracture of neck of fifth metacarpal bone, right hand, initial encounter for closed fracture: Principal | ICD-10-CM

## 2019-08-13 DIAGNOSIS — S6991XA Unspecified injury of right wrist, hand and finger(s), initial encounter: Principal | ICD-10-CM

## 2019-08-13 DIAGNOSIS — F17200 Nicotine dependence, unspecified, uncomplicated: Secondary | ICD-10-CM

## 2019-08-13 DIAGNOSIS — D571 Sickle-cell disease without crisis: Secondary | ICD-10-CM

## 2019-08-13 DIAGNOSIS — G43009 Migraine without aura, not intractable, without status migrainosus: Secondary | ICD-10-CM

## 2019-08-13 DIAGNOSIS — Z1159 Encounter for screening for other viral diseases: Secondary | ICD-10-CM

## 2019-08-13 DIAGNOSIS — Z114 Encounter for screening for human immunodeficiency virus [HIV]: Secondary | ICD-10-CM

## 2019-08-16 MED ORDER — IBUPROFEN 400 MG TABLET
ORAL_TABLET | Freq: Three times a day (TID) | ORAL | 1 refills | 10 days | Status: CP | PRN
Start: 2019-08-16 — End: ?

## 2019-08-19 ENCOUNTER — Encounter
Admit: 2019-08-19 | Discharge: 2019-08-20 | Payer: PRIVATE HEALTH INSURANCE | Attending: Hematology | Primary: Hematology

## 2019-08-19 DIAGNOSIS — D571 Sickle-cell disease without crisis: Secondary | ICD-10-CM

## 2019-08-20 DIAGNOSIS — D571 Sickle-cell disease without crisis: Secondary | ICD-10-CM

## 2019-08-20 MED ORDER — OXYCODONE 5 MG TABLET
ORAL_TABLET | ORAL | 0 refills | 4.00000 days | Status: CP | PRN
Start: 2019-08-20 — End: 2019-09-19

## 2019-08-27 MED ORDER — AMITRIPTYLINE 10 MG TABLET
ORAL_TABLET | 3 refills | 0 days | Status: CP
Start: 2019-08-27 — End: ?

## 2019-09-07 ENCOUNTER — Encounter: Admit: 2019-09-07 | Discharge: 2019-09-07 | Payer: PRIVATE HEALTH INSURANCE

## 2019-09-07 ENCOUNTER — Encounter
Admit: 2019-09-07 | Discharge: 2019-10-06 | Payer: PRIVATE HEALTH INSURANCE | Attending: Rehabilitative and Restorative Service Providers" | Primary: Rehabilitative and Restorative Service Providers"

## 2019-09-07 ENCOUNTER — Ambulatory Visit: Admit: 2019-09-07 | Discharge: 2019-09-07 | Payer: PRIVATE HEALTH INSURANCE

## 2019-09-07 DIAGNOSIS — Z79899 Other long term (current) drug therapy: Secondary | ICD-10-CM

## 2019-09-07 DIAGNOSIS — F17200 Nicotine dependence, unspecified, uncomplicated: Secondary | ICD-10-CM

## 2019-09-07 DIAGNOSIS — S62336D Displaced fracture of neck of fifth metacarpal bone, right hand, subsequent encounter for fracture with routine healing: Secondary | ICD-10-CM

## 2019-09-07 DIAGNOSIS — M25641 Stiffness of right hand, not elsewhere classified: Secondary | ICD-10-CM

## 2019-09-07 DIAGNOSIS — D571 Sickle-cell disease without crisis: Secondary | ICD-10-CM

## 2019-09-07 DIAGNOSIS — S62336A Displaced fracture of neck of fifth metacarpal bone, right hand, initial encounter for closed fracture: Secondary | ICD-10-CM

## 2019-09-07 DIAGNOSIS — D5700 Hb-SS disease with crisis, unspecified: Secondary | ICD-10-CM

## 2019-09-08 DIAGNOSIS — D5700 Hb-SS disease with crisis, unspecified: Secondary | ICD-10-CM

## 2019-09-08 MED ORDER — OXYCODONE 5 MG TABLET
ORAL_TABLET | ORAL | 0 refills | 5 days | Status: CP | PRN
Start: 2019-09-08 — End: 2019-10-08

## 2019-09-29 ENCOUNTER — Non-Acute Institutional Stay: Admit: 2019-09-29 | Discharge: 2019-09-29 | Payer: PRIVATE HEALTH INSURANCE

## 2019-09-29 ENCOUNTER — Encounter
Admit: 2019-09-29 | Discharge: 2019-09-29 | Payer: PRIVATE HEALTH INSURANCE | Attending: Hematology | Primary: Hematology

## 2019-09-29 DIAGNOSIS — D571 Sickle-cell disease without crisis: Principal | ICD-10-CM

## 2019-09-29 MED ORDER — HYDROXYUREA 500 MG CAPSULE: capsule | 2 refills | 0 days | Status: AC

## 2019-09-29 MED ORDER — IBUPROFEN 400 MG TABLET: 400 mg | tablet | Freq: Three times a day (TID) | 2 refills | 10 days | Status: AC

## 2019-09-30 ENCOUNTER — Encounter
Admit: 2019-09-30 | Discharge: 2019-10-01 | Disposition: A | Discharge status: Home / Self Care | Payer: PRIVATE HEALTH INSURANCE

## 2019-10-04 DIAGNOSIS — D5700 Hb-SS disease with crisis, unspecified: Principal | ICD-10-CM

## 2019-10-04 MED ORDER — OXYCODONE 5 MG TABLET: 5 mg | tablet | 0 refills | 5 days | Status: AC

## 2019-10-05 ENCOUNTER — Ambulatory Visit: Admit: 2019-10-05 | Discharge: 2019-10-05 | Payer: PRIVATE HEALTH INSURANCE

## 2019-10-05 ENCOUNTER — Encounter: Admit: 2019-10-05 | Discharge: 2019-10-05 | Payer: PRIVATE HEALTH INSURANCE

## 2019-10-16 ENCOUNTER — Encounter
Admit: 2019-10-16 | Discharge: 2019-10-16 | Disposition: A | Discharge status: Home / Self Care | Payer: PRIVATE HEALTH INSURANCE

## 2019-10-16 DIAGNOSIS — D5700 Hb-SS disease with crisis, unspecified: Principal | ICD-10-CM

## 2019-10-28 DIAGNOSIS — D5700 Hb-SS disease with crisis, unspecified: Principal | ICD-10-CM

## 2019-10-28 MED ORDER — OXYCODONE 5 MG TABLET
ORAL_TABLET | ORAL | 0 refills | 5 days | Status: CP | PRN
Start: 2019-10-28 — End: 2019-11-27

## 2019-10-29 ENCOUNTER — Encounter: Admit: 2019-10-29 | Discharge: 2019-10-30 | Payer: PRIVATE HEALTH INSURANCE

## 2019-10-29 ENCOUNTER — Encounter
Admit: 2019-10-29 | Discharge: 2019-11-05 | Payer: PRIVATE HEALTH INSURANCE | Attending: Rehabilitative and Restorative Service Providers" | Primary: Rehabilitative and Restorative Service Providers"

## 2019-11-04 ENCOUNTER — Encounter
Admit: 2019-11-04 | Discharge: 2019-11-05 | Payer: PRIVATE HEALTH INSURANCE | Attending: Hematology | Primary: Hematology

## 2019-11-16 ENCOUNTER — Ambulatory Visit: Admit: 2019-11-16 | Discharge: 2019-11-17 | Payer: PRIVATE HEALTH INSURANCE

## 2019-11-16 DIAGNOSIS — D571 Sickle-cell disease without crisis: Principal | ICD-10-CM

## 2019-11-16 DIAGNOSIS — D5700 Hb-SS disease with crisis, unspecified: Principal | ICD-10-CM

## 2019-11-16 MED ORDER — IBUPROFEN 400 MG TABLET
ORAL_TABLET | Freq: Three times a day (TID) | ORAL | 2 refills | 10 days | Status: CP | PRN
Start: 2019-11-16 — End: ?

## 2019-11-19 MED ORDER — IBUPROFEN 400 MG TABLET
ORAL_TABLET | Freq: Three times a day (TID) | ORAL | 2 refills | 10 days | Status: CP | PRN
Start: 2019-11-19 — End: ?

## 2019-11-19 MED ORDER — OXYCODONE 5 MG TABLET
ORAL_TABLET | ORAL | 0 refills | 5 days | Status: CP | PRN
Start: 2019-11-19 — End: 2019-12-19

## 2019-12-08 ENCOUNTER — Encounter
Admit: 2019-12-08 | Discharge: 2019-12-09 | Payer: PRIVATE HEALTH INSURANCE | Attending: Hematology | Primary: Hematology

## 2019-12-08 DIAGNOSIS — D571 Sickle-cell disease without crisis: Principal | ICD-10-CM

## 2019-12-08 MED ORDER — OXYCODONE 5 MG CAPSULE
ORAL_CAPSULE | ORAL | 0 refills | 2.00000 days | Status: CP | PRN
Start: 2019-12-08 — End: ?

## 2019-12-09 MED ORDER — HYDROXYUREA 500 MG CAPSULE
ORAL_CAPSULE | 2 refills | 0 days | Status: CP
Start: 2019-12-09 — End: ?

## 2019-12-19 DIAGNOSIS — D5700 Hb-SS disease with crisis, unspecified: Principal | ICD-10-CM

## 2019-12-20 DIAGNOSIS — D5700 Hb-SS disease with crisis, unspecified: Principal | ICD-10-CM

## 2019-12-20 MED ORDER — OXYCODONE 5 MG TABLET
ORAL_TABLET | ORAL | 0 refills | 5 days | Status: CP | PRN
Start: 2019-12-20 — End: 2020-01-19

## 2019-12-21 ENCOUNTER — Encounter
Admit: 2019-12-21 | Discharge: 2020-01-04 | Payer: PRIVATE HEALTH INSURANCE | Attending: Rehabilitative and Restorative Service Providers" | Primary: Rehabilitative and Restorative Service Providers"

## 2019-12-21 ENCOUNTER — Encounter: Admit: 2019-12-21 | Discharge: 2019-12-22 | Payer: PRIVATE HEALTH INSURANCE

## 2019-12-21 DIAGNOSIS — S62336D Displaced fracture of neck of fifth metacarpal bone, right hand, subsequent encounter for fracture with routine healing: Principal | ICD-10-CM

## 2020-01-04 DIAGNOSIS — D571 Sickle-cell disease without crisis: Principal | ICD-10-CM

## 2020-01-04 MED ORDER — AMITRIPTYLINE 10 MG TABLET
ORAL_TABLET | Freq: Every evening | ORAL | 3 refills | 30.00000 days | Status: CP
Start: 2020-01-04 — End: ?

## 2020-01-07 ENCOUNTER — Encounter: Admit: 2020-01-07 | Discharge: 2020-01-08 | Payer: PRIVATE HEALTH INSURANCE

## 2020-01-13 ENCOUNTER — Encounter: Admit: 2020-01-13 | Discharge: 2020-01-14 | Payer: PRIVATE HEALTH INSURANCE

## 2020-01-13 DIAGNOSIS — D571 Sickle-cell disease without crisis: Principal | ICD-10-CM

## 2020-01-13 DIAGNOSIS — D5700 Hb-SS disease with crisis, unspecified: Principal | ICD-10-CM

## 2020-01-13 MED ORDER — OXYCODONE 5 MG TABLET
ORAL_TABLET | ORAL | 0 refills | 5.00000 days | Status: CP | PRN
Start: 2020-01-13 — End: 2020-02-12

## 2020-02-03 DIAGNOSIS — D5700 Hb-SS disease with crisis, unspecified: Principal | ICD-10-CM

## 2020-02-03 MED ORDER — OXYCODONE 5 MG TABLET
ORAL_TABLET | ORAL | 0 refills | 3 days | Status: CP | PRN
Start: 2020-02-03 — End: 2020-02-13

## 2020-02-11 ENCOUNTER — Encounter
Admit: 2020-02-11 | Discharge: 2020-03-04 | Payer: PRIVATE HEALTH INSURANCE | Attending: Rehabilitative and Restorative Service Providers" | Primary: Rehabilitative and Restorative Service Providers"

## 2020-02-14 DIAGNOSIS — D5700 Hb-SS disease with crisis, unspecified: Principal | ICD-10-CM

## 2020-02-14 MED ORDER — OXYCODONE 5 MG TABLET
ORAL_TABLET | ORAL | 0 refills | 5 days | Status: CP | PRN
Start: 2020-02-14 — End: ?

## 2020-02-14 MED ORDER — FOLIC ACID 1 MG TABLET: tablet | 1 refills | 0 days | Status: AC

## 2020-02-14 MED ORDER — FOLIC ACID 1 MG TABLET
ORAL_TABLET | Freq: Every day | ORAL | 1 refills | 0.00000 days | Status: CP
Start: 2020-02-14 — End: 2021-02-13

## 2020-03-03 ENCOUNTER — Encounter: Admit: 2020-03-03 | Discharge: 2020-03-04 | Payer: PRIVATE HEALTH INSURANCE

## 2020-03-03 DIAGNOSIS — D571 Sickle-cell disease without crisis: Principal | ICD-10-CM

## 2020-03-03 DIAGNOSIS — D5700 Hb-SS disease with crisis, unspecified: Principal | ICD-10-CM

## 2020-03-03 MED ORDER — IBUPROFEN 400 MG TABLET
ORAL_TABLET | Freq: Three times a day (TID) | ORAL | 2 refills | 10.00000 days | Status: CP | PRN
Start: 2020-03-03 — End: ?

## 2020-03-03 MED ORDER — OXYCODONE 5 MG TABLET
ORAL_TABLET | ORAL | 0 refills | 5.00000 days | Status: CP | PRN
Start: 2020-03-03 — End: ?

## 2020-03-03 MED ORDER — HYDROXYUREA 500 MG CAPSULE
ORAL_CAPSULE | 2 refills | 0 days | Status: CP
Start: 2020-03-03 — End: ?

## 2020-03-03 MED ORDER — ACETAMINOPHEN 500 MG TABLET
ORAL_TABLET | Freq: Three times a day (TID) | ORAL | 1 refills | 5.00000 days
Start: 2020-03-03 — End: ?

## 2020-03-07 MED ORDER — ERGOCALCIFEROL (VITAMIN D2) 1,250 MCG (50,000 UNIT) CAPSULE
ORAL_CAPSULE | ORAL | 0 refills | 56 days | Status: CP
Start: 2020-03-07 — End: 2020-04-26

## 2020-03-21 ENCOUNTER — Encounter
Admit: 2020-03-21 | Discharge: 2020-04-03 | Payer: PRIVATE HEALTH INSURANCE | Attending: Rehabilitative and Restorative Service Providers" | Primary: Rehabilitative and Restorative Service Providers"

## 2020-03-21 DIAGNOSIS — D5700 Hb-SS disease with crisis, unspecified: Principal | ICD-10-CM

## 2020-03-21 MED ORDER — OXYCODONE 5 MG TABLET
ORAL_TABLET | ORAL | 0 refills | 5.00000 days | Status: CP | PRN
Start: 2020-03-21 — End: ?

## 2020-04-03 ENCOUNTER — Telehealth: Admit: 2020-04-03 | Discharge: 2020-04-04 | Payer: PRIVATE HEALTH INSURANCE

## 2020-04-10 ENCOUNTER — Encounter: Admit: 2020-04-10 | Discharge: 2020-04-11 | Payer: PRIVATE HEALTH INSURANCE

## 2020-04-10 DIAGNOSIS — D571 Sickle-cell disease without crisis: Principal | ICD-10-CM

## 2020-04-10 DIAGNOSIS — D5700 Hb-SS disease with crisis, unspecified: Principal | ICD-10-CM

## 2020-04-10 MED ORDER — OXYCODONE 5 MG TABLET
ORAL_TABLET | ORAL | 0 refills | 5 days | Status: CP | PRN
Start: 2020-04-10 — End: ?

## 2020-04-26 DIAGNOSIS — D5700 Hb-SS disease with crisis, unspecified: Principal | ICD-10-CM

## 2020-04-26 MED ORDER — OXYCODONE 5 MG TABLET
ORAL_TABLET | ORAL | 0 refills | 5 days | Status: CP | PRN
Start: 2020-04-26 — End: ?

## 2020-04-26 MED ORDER — IBUPROFEN 400 MG TABLET
ORAL_TABLET | Freq: Three times a day (TID) | ORAL | 2 refills | 10.00000 days | Status: CP | PRN
Start: 2020-04-26 — End: ?

## 2020-05-12 DIAGNOSIS — D5700 Hb-SS disease with crisis, unspecified: Principal | ICD-10-CM

## 2020-05-12 DIAGNOSIS — D571 Sickle-cell disease without crisis: Principal | ICD-10-CM

## 2020-05-12 MED ORDER — HYDROXYUREA 500 MG CAPSULE
ORAL_CAPSULE | ORAL | 2 refills | 0.00000 days | Status: CP
Start: 2020-05-12 — End: ?

## 2020-05-12 MED ORDER — OXYCODONE 5 MG TABLET
ORAL_TABLET | ORAL | 0 refills | 5 days | Status: CP | PRN
Start: 2020-05-12 — End: ?

## 2020-06-02 ENCOUNTER — Ambulatory Visit: Admit: 2020-06-02 | Payer: PRIVATE HEALTH INSURANCE

## 2020-06-07 DIAGNOSIS — D571 Sickle-cell disease without crisis: Principal | ICD-10-CM

## 2020-06-08 ENCOUNTER — Encounter: Admit: 2020-06-08 | Discharge: 2020-06-08 | Payer: PRIVATE HEALTH INSURANCE

## 2020-06-08 ENCOUNTER — Institutional Professional Consult (permissible substitution): Admit: 2020-06-08 | Discharge: 2020-06-08 | Payer: PRIVATE HEALTH INSURANCE

## 2020-06-08 DIAGNOSIS — R002 Palpitations: Principal | ICD-10-CM

## 2020-06-08 DIAGNOSIS — D5700 Hb-SS disease with crisis, unspecified: Principal | ICD-10-CM

## 2020-06-08 MED ORDER — FOLIC ACID 1 MG TABLET
ORAL_TABLET | Freq: Every day | ORAL | 3 refills | 90.00000 days | Status: CP
Start: 2020-06-08 — End: ?

## 2020-06-08 MED ORDER — CHOLECALCIFEROL (VITAMIN D3) 25 MCG (1,000 UNIT) TABLET
ORAL_TABLET | Freq: Every day | ORAL | 11 refills | 30.00000 days | Status: CP
Start: 2020-06-08 — End: 2021-06-08

## 2020-06-08 MED ORDER — OXYCODONE 5 MG TABLET
ORAL_TABLET | ORAL | 0 refills | 5.00000 days | Status: CP | PRN
Start: 2020-06-08 — End: ?

## 2020-06-08 MED ORDER — AMITRIPTYLINE 10 MG TABLET
ORAL_TABLET | ORAL | 3 refills | 0.00000 days | Status: CP
Start: 2020-06-08 — End: ?

## 2020-06-14 ENCOUNTER — Ambulatory Visit
Admit: 2020-06-14 | Discharge: 2020-06-14 | Disposition: A | Discharge status: Home / Self Care | Payer: PRIVATE HEALTH INSURANCE

## 2020-06-16 MED ORDER — IBUPROFEN 400 MG TABLET
ORAL_TABLET | Freq: Three times a day (TID) | ORAL | 2 refills | 10.00000 days | Status: CP | PRN
Start: 2020-06-16 — End: ?

## 2020-06-20 ENCOUNTER — Encounter
Admit: 2020-06-20 | Discharge: 2020-06-20 | Disposition: A | Discharge status: Home / Self Care | Payer: PRIVATE HEALTH INSURANCE

## 2020-06-20 DIAGNOSIS — D5700 Hb-SS disease with crisis, unspecified: Principal | ICD-10-CM

## 2020-06-20 MED ORDER — OXYCODONE 10 MG TABLET
ORAL_TABLET | ORAL | 0 refills | 5.00000 days | Status: CP | PRN
Start: 2020-06-20 — End: ?

## 2020-07-03 DIAGNOSIS — D5700 Hb-SS disease with crisis, unspecified: Principal | ICD-10-CM

## 2020-07-03 MED ORDER — OXYCODONE 10 MG TABLET
ORAL_TABLET | ORAL | 0 refills | 5.00000 days | Status: CP | PRN
Start: 2020-07-03 — End: ?

## 2020-07-12 DIAGNOSIS — D571 Sickle-cell disease without crisis: Principal | ICD-10-CM

## 2020-07-12 DIAGNOSIS — I471 Supraventricular tachycardia: Principal | ICD-10-CM

## 2020-07-14 ENCOUNTER — Ambulatory Visit: Admit: 2020-07-14 | Discharge: 2020-07-15 | Payer: PRIVATE HEALTH INSURANCE

## 2020-07-18 ENCOUNTER — Ambulatory Visit: Admit: 2020-07-18 | Discharge: 2020-07-19 | Payer: PRIVATE HEALTH INSURANCE

## 2020-07-18 DIAGNOSIS — D571 Sickle-cell disease without crisis: Principal | ICD-10-CM

## 2020-07-19 DIAGNOSIS — D5700 Hb-SS disease with crisis, unspecified: Principal | ICD-10-CM

## 2020-07-19 MED ORDER — OXYCODONE 10 MG TABLET
ORAL_TABLET | ORAL | 0 refills | 5 days | Status: CP | PRN
Start: 2020-07-19 — End: ?

## 2020-07-25 DIAGNOSIS — D571 Sickle-cell disease without crisis: Principal | ICD-10-CM

## 2020-07-26 DIAGNOSIS — D571 Sickle-cell disease without crisis: Principal | ICD-10-CM

## 2020-07-26 MED ORDER — AMITRIPTYLINE 10 MG TABLET
ORAL_TABLET | Freq: Every evening | ORAL | 1 refills | 90.00000 days | Status: CP
Start: 2020-07-26 — End: ?

## 2020-07-26 MED ORDER — HYDROXYUREA 500 MG CAPSULE
ORAL_CAPSULE | 2 refills | 0 days | Status: CP
Start: 2020-07-26 — End: ?

## 2020-07-31 MED ORDER — IBUPROFEN 400 MG TABLET
ORAL_TABLET | Freq: Three times a day (TID) | ORAL | 2 refills | 10 days | Status: CP | PRN
Start: 2020-07-31 — End: ?

## 2020-08-01 DIAGNOSIS — D5700 Hb-SS disease with crisis, unspecified: Principal | ICD-10-CM

## 2020-08-01 MED ORDER — OXYCODONE 10 MG TABLET
ORAL_TABLET | ORAL | 0 refills | 5.00000 days | Status: CP | PRN
Start: 2020-08-01 — End: ?

## 2020-08-09 ENCOUNTER — Ambulatory Visit
Admit: 2020-08-09 | Discharge: 2020-08-10 | Payer: PRIVATE HEALTH INSURANCE | Attending: Student in an Organized Health Care Education/Training Program | Primary: Student in an Organized Health Care Education/Training Program

## 2020-08-09 MED ORDER — PROPRANOLOL 10 MG TABLET
ORAL_TABLET | Freq: Two times a day (BID) | ORAL | 3 refills | 30.00000 days | Status: CP | PRN
Start: 2020-08-09 — End: 2021-08-09
  Filled 2020-08-09: qty 60, 30d supply, fill #0

## 2020-08-09 MED FILL — PROPRANOLOL 10 MG TABLET: 30 days supply | Qty: 60 | Fill #0 | Status: AC

## 2020-08-14 DIAGNOSIS — D5700 Hb-SS disease with crisis, unspecified: Principal | ICD-10-CM

## 2020-08-14 MED ORDER — OXYCODONE 10 MG TABLET
ORAL_TABLET | ORAL | 0 refills | 5 days | Status: CP | PRN
Start: 2020-08-14 — End: ?
  Filled 2020-08-14: qty 30, 5d supply, fill #0

## 2020-08-14 MED FILL — OXYCODONE 10 MG TABLET: 5 days supply | Qty: 30 | Fill #0 | Status: AC

## 2020-08-28 DIAGNOSIS — D5700 Hb-SS disease with crisis, unspecified: Principal | ICD-10-CM

## 2020-08-28 MED ORDER — OXYCODONE 10 MG TABLET
ORAL_TABLET | ORAL | 0 refills | 5.00000 days | Status: CP | PRN
Start: 2020-08-28 — End: ?

## 2020-09-05 ENCOUNTER — Ambulatory Visit: Admit: 2020-09-05 | Discharge: 2020-09-06 | Payer: PRIVATE HEALTH INSURANCE

## 2020-09-11 DIAGNOSIS — D5700 Hb-SS disease with crisis, unspecified: Principal | ICD-10-CM

## 2020-09-11 MED ORDER — OXYCODONE 10 MG TABLET
ORAL_TABLET | ORAL | 0 refills | 5 days | Status: CP | PRN
Start: 2020-09-11 — End: 2020-09-25

## 2020-09-21 ENCOUNTER — Ambulatory Visit: Admit: 2020-09-21 | Payer: PRIVATE HEALTH INSURANCE

## 2020-10-03 DIAGNOSIS — D571 Sickle-cell disease without crisis: Principal | ICD-10-CM

## 2020-10-03 DIAGNOSIS — D5700 Hb-SS disease with crisis, unspecified: Principal | ICD-10-CM

## 2020-10-03 MED ORDER — OXYCODONE 10 MG TABLET
ORAL_TABLET | ORAL | 0 refills | 5.00000 days | Status: CP | PRN
Start: 2020-10-03 — End: 2020-10-17

## 2020-10-04 DIAGNOSIS — D5700 Hb-SS disease with crisis, unspecified: Principal | ICD-10-CM

## 2020-10-04 MED ORDER — IBUPROFEN 400 MG TABLET
ORAL_TABLET | Freq: Three times a day (TID) | ORAL | 0 refills | 10.00000 days | Status: CP | PRN
Start: 2020-10-04 — End: ?

## 2020-10-05 ENCOUNTER — Telehealth: Admit: 2020-10-05 | Discharge: 2020-10-06 | Payer: PRIVATE HEALTH INSURANCE

## 2020-10-05 MED ORDER — HYDROXYUREA 500 MG CAPSULE
ORAL_CAPSULE | ORAL | 1 refills | 0.00000 days | Status: CP
Start: 2020-10-05 — End: ?

## 2020-10-10 ENCOUNTER — Ambulatory Visit: Admit: 2020-10-10 | Discharge: 2020-10-11 | Payer: PRIVATE HEALTH INSURANCE

## 2020-10-10 ENCOUNTER — Ambulatory Visit
Admit: 2020-10-10 | Discharge: 2020-10-11 | Payer: PRIVATE HEALTH INSURANCE | Attending: Adult Health | Primary: Adult Health

## 2020-10-10 DIAGNOSIS — D571 Sickle-cell disease without crisis: Principal | ICD-10-CM

## 2020-10-10 MED ORDER — LIDOCAINE 5 % TOPICAL PATCH
MEDICATED_PATCH | Freq: Two times a day (BID) | TRANSDERMAL | 0 refills | 5 days | Status: CP
Start: 2020-10-10 — End: 2021-10-10

## 2020-10-10 MED ORDER — HYDROXYUREA 500 MG CAPSULE
ORAL_CAPSULE | 0 refills | 0 days | Status: CP
Start: 2020-10-10 — End: ?

## 2020-10-10 MED ORDER — OXYCODONE 10 MG TABLET: 10 mg | tablet | 0 refills | 8 days | Status: AC

## 2020-10-10 MED ORDER — FOLIC ACID 1 MG TABLET
ORAL_TABLET | Freq: Every day | ORAL | 3 refills | 90 days | Status: CP
Start: 2020-10-10 — End: ?
  Filled 2020-10-10: qty 90, 90d supply, fill #0

## 2020-10-10 MED ORDER — CHOLECALCIFEROL (VITAMIN D3) 25 MCG (1,000 UNIT) TABLET
ORAL_TABLET | Freq: Every day | ORAL | 3 refills | 90.00000 days | Status: CP
Start: 2020-10-10 — End: 2021-10-10

## 2020-10-10 MED ORDER — OXYCODONE 10 MG TABLET
ORAL_TABLET | ORAL | 0 refills | 5.00000 days | Status: CP | PRN
Start: 2020-10-10 — End: 2020-10-10
  Filled 2020-10-10: qty 30, 5d supply, fill #0

## 2020-10-10 MED ORDER — AMITRIPTYLINE 10 MG TABLET
ORAL_TABLET | Freq: Every evening | ORAL | 3 refills | 135.00000 days | Status: CP
Start: 2020-10-10 — End: ?
  Filled 2020-10-10: qty 180, 90d supply, fill #0

## 2020-10-10 MED ORDER — CAPSAICIN 0.025 % TOPICAL CREAM
Freq: Two times a day (BID) | TOPICAL | 0 refills | 0.00000 days | Status: CP
Start: 2020-10-10 — End: 2021-10-10

## 2020-10-10 MED FILL — FOLIC ACID 1 MG TABLET: 90 days supply | Qty: 90 | Fill #0 | Status: AC

## 2020-10-10 MED FILL — AMITRIPTYLINE 10 MG TABLET: 90 days supply | Qty: 180 | Fill #0 | Status: AC

## 2020-10-10 MED FILL — OXYCODONE 10 MG TABLET: 5 days supply | Qty: 30 | Fill #0 | Status: AC

## 2020-10-23 DIAGNOSIS — D5700 Hb-SS disease with crisis, unspecified: Principal | ICD-10-CM

## 2020-10-24 MED ORDER — OXYCODONE 10 MG TABLET
ORAL_TABLET | ORAL | 0 refills | 5.00000 days | Status: CP | PRN
Start: 2020-10-24 — End: 2020-10-24

## 2020-10-24 MED ORDER — OXYCODONE 10 MG TABLET: 10 mg | tablet | 0 refills | 5 days | Status: AC

## 2020-10-31 DIAGNOSIS — D571 Sickle-cell disease without crisis: Principal | ICD-10-CM

## 2020-10-31 MED ORDER — HYDROXYUREA 500 MG CAPSULE
ORAL_CAPSULE | ORAL | 1 refills | 0.00000 days | Status: CP
Start: 2020-10-31 — End: 2020-11-15

## 2020-11-03 DIAGNOSIS — D5700 Hb-SS disease with crisis, unspecified: Principal | ICD-10-CM

## 2020-11-03 MED ORDER — OXYCODONE 10 MG TABLET
ORAL_TABLET | ORAL | 0 refills | 5.00000 days | Status: CP | PRN
Start: 2020-11-03 — End: 2020-11-15

## 2020-11-15 ENCOUNTER — Ambulatory Visit
Admit: 2020-11-15 | Discharge: 2020-11-15 | Payer: PRIVATE HEALTH INSURANCE | Attending: Adult Health | Primary: Adult Health

## 2020-11-15 ENCOUNTER — Ambulatory Visit
Admit: 2020-11-15 | Discharge: 2020-11-15 | Payer: PRIVATE HEALTH INSURANCE | Attending: Student in an Organized Health Care Education/Training Program | Primary: Student in an Organized Health Care Education/Training Program

## 2020-11-15 ENCOUNTER — Ambulatory Visit: Admit: 2020-11-15 | Discharge: 2020-11-15 | Payer: PRIVATE HEALTH INSURANCE

## 2020-11-15 DIAGNOSIS — R0600 Dyspnea, unspecified: Principal | ICD-10-CM

## 2020-11-15 DIAGNOSIS — D571 Sickle-cell disease without crisis: Principal | ICD-10-CM

## 2020-11-15 DIAGNOSIS — D5700 Hb-SS disease with crisis, unspecified: Principal | ICD-10-CM

## 2020-11-15 DIAGNOSIS — R002 Palpitations: Principal | ICD-10-CM

## 2020-11-15 DIAGNOSIS — G4734 Idiopathic sleep related nonobstructive alveolar hypoventilation: Principal | ICD-10-CM

## 2020-11-15 MED ORDER — OXYCODONE 10 MG TABLET
ORAL_TABLET | ORAL | 0 refills | 10.00000 days | Status: CP | PRN
Start: 2020-11-15 — End: 2020-11-15
  Filled 2020-11-15: qty 60, 10d supply, fill #0

## 2020-11-15 MED ORDER — HYDROXYUREA 500 MG CAPSULE: capsule | 1 refills | 0 days | Status: AC

## 2020-11-15 MED ORDER — OXYCODONE 10 MG TABLET: 10 mg | tablet | 0 refills | 10 days | Status: AC

## 2020-11-15 MED ORDER — HYDROXYUREA 500 MG CAPSULE
ORAL_CAPSULE | ORAL | 1 refills | 0.00000 days | Status: CP
Start: 2020-11-15 — End: 2020-11-15
  Filled 2020-11-15: qty 80, 30d supply, fill #0

## 2020-11-15 MED ORDER — METOPROLOL SUCCINATE ER 25 MG TABLET,EXTENDED RELEASE 24 HR
ORAL_TABLET | Freq: Every evening | ORAL | 11 refills | 30.00000 days | Status: CP
Start: 2020-11-15 — End: 2021-11-15
  Filled 2020-11-15: qty 30, 30d supply, fill #0

## 2020-11-15 MED ORDER — IBUPROFEN 400 MG TABLET
ORAL_TABLET | Freq: Three times a day (TID) | ORAL | 0 refills | 30 days | Status: CP | PRN
Start: 2020-11-15 — End: 2021-01-26
  Filled 2020-11-15: qty 90, 30d supply, fill #0

## 2020-11-15 MED FILL — METOPROLOL SUCCINATE ER 25 MG TABLET,EXTENDED RELEASE 24 HR: 30 days supply | Qty: 30 | Fill #0 | Status: AC

## 2020-11-15 MED FILL — OXYCODONE 10 MG TABLET: 10 days supply | Qty: 60 | Fill #0 | Status: AC

## 2020-11-15 MED FILL — IBUPROFEN 400 MG TABLET: 30 days supply | Qty: 90 | Fill #0 | Status: AC

## 2020-11-15 MED FILL — HYDROXYUREA 500 MG CAPSULE: 30 days supply | Qty: 80 | Fill #0 | Status: AC

## 2020-11-23 ENCOUNTER — Ambulatory Visit: Admit: 2020-11-23 | Discharge: 2020-12-06 | Payer: PRIVATE HEALTH INSURANCE

## 2020-11-23 DIAGNOSIS — R0600 Dyspnea, unspecified: Principal | ICD-10-CM

## 2020-12-13 MED ORDER — OXYCODONE 10 MG TABLET
ORAL_TABLET | ORAL | 0 refills | 10 days | Status: CP | PRN
Start: 2020-12-13 — End: 2021-01-10

## 2021-01-05 DIAGNOSIS — D571 Sickle-cell disease without crisis: Principal | ICD-10-CM

## 2021-01-10 ENCOUNTER — Ambulatory Visit: Admit: 2021-01-10 | Discharge: 2021-01-11 | Payer: PRIVATE HEALTH INSURANCE

## 2021-01-10 DIAGNOSIS — D5700 Hb-SS disease with crisis, unspecified: Principal | ICD-10-CM

## 2021-01-10 DIAGNOSIS — D571 Sickle-cell disease without crisis: Principal | ICD-10-CM

## 2021-01-10 MED ORDER — FOLIC ACID 1 MG TABLET
ORAL_TABLET | Freq: Every day | ORAL | 3 refills | 90.00000 days | Status: CP
Start: 2021-01-10 — End: 2021-01-10

## 2021-01-10 MED ORDER — HYDROXYUREA 500 MG CAPSULE
ORAL_CAPSULE | ORAL | 1 refills | 0.00000 days | Status: CP
Start: 2021-01-10 — End: 2021-01-10

## 2021-01-10 MED ORDER — OXYCODONE 10 MG TABLET
ORAL_TABLET | ORAL | 0 refills | 10.00000 days | Status: CP | PRN
Start: 2021-01-10 — End: 2021-01-26

## 2021-01-26 DIAGNOSIS — D5700 Hb-SS disease with crisis, unspecified: Principal | ICD-10-CM

## 2021-01-26 MED ORDER — IBUPROFEN 400 MG TABLET
ORAL_TABLET | Freq: Three times a day (TID) | ORAL | 0 refills | 30.00000 days | Status: CP | PRN
Start: 2021-01-26 — End: 2021-01-26

## 2021-01-26 MED ORDER — METOPROLOL SUCCINATE ER 25 MG TABLET,EXTENDED RELEASE 24 HR
ORAL_TABLET | Freq: Every evening | ORAL | 11 refills | 30.00000 days | Status: CP
Start: 2021-01-26 — End: 2022-01-26

## 2021-01-26 MED ORDER — OXYCODONE 10 MG TABLET
ORAL_TABLET | ORAL | 0 refills | 10.00000 days | Status: CP | PRN
Start: 2021-01-26 — End: 2021-01-26

## 2021-02-01 DIAGNOSIS — D571 Sickle-cell disease without crisis: Principal | ICD-10-CM

## 2021-02-01 MED ORDER — HYDROXYUREA 500 MG CAPSULE
ORAL_CAPSULE | ORAL | 1 refills | 0.00000 days | Status: CP
Start: 2021-02-01 — End: ?

## 2021-02-12 ENCOUNTER — Telehealth: Admit: 2021-02-12 | Discharge: 2021-02-13 | Payer: PRIVATE HEALTH INSURANCE

## 2021-02-13 ENCOUNTER — Ambulatory Visit: Admit: 2021-02-13 | Discharge: 2021-02-14 | Payer: PRIVATE HEALTH INSURANCE

## 2021-02-14 DIAGNOSIS — D5700 Hb-SS disease with crisis, unspecified: Principal | ICD-10-CM

## 2021-02-14 MED ORDER — IBUPROFEN 400 MG TABLET
ORAL_TABLET | Freq: Three times a day (TID) | ORAL | 1 refills | 30.00000 days | Status: CP | PRN
Start: 2021-02-14 — End: ?

## 2021-02-14 MED ORDER — OXYCODONE 10 MG TABLET
ORAL_TABLET | ORAL | 0 refills | 10 days | Status: CP | PRN
Start: 2021-02-14 — End: ?

## 2021-03-02 DIAGNOSIS — D5700 Hb-SS disease with crisis, unspecified: Principal | ICD-10-CM

## 2021-03-02 DIAGNOSIS — K047 Periapical abscess without sinus: Principal | ICD-10-CM

## 2021-03-02 MED ORDER — OXYCODONE 10 MG TABLET
ORAL_TABLET | ORAL | 0 refills | 10.00000 days | Status: CP | PRN
Start: 2021-03-02 — End: ?

## 2021-03-02 MED ORDER — IBUPROFEN 400 MG TABLET
ORAL_TABLET | Freq: Three times a day (TID) | ORAL | 1 refills | 30.00000 days | Status: CP | PRN
Start: 2021-03-02 — End: ?

## 2021-03-02 MED ORDER — AMOXICILLIN 875 MG-POTASSIUM CLAVULANATE 125 MG TABLET
ORAL_TABLET | Freq: Two times a day (BID) | ORAL | 0 refills | 7.00000 days | Status: CP
Start: 2021-03-02 — End: 2021-03-09

## 2021-03-08 DIAGNOSIS — D571 Sickle-cell disease without crisis: Principal | ICD-10-CM

## 2021-03-19 ENCOUNTER — Ambulatory Visit: Admit: 2021-03-19 | Discharge: 2021-03-20 | Payer: PRIVATE HEALTH INSURANCE

## 2021-03-19 ENCOUNTER — Ambulatory Visit
Admit: 2021-03-19 | Discharge: 2021-03-20 | Payer: PRIVATE HEALTH INSURANCE | Attending: Internal Medicine | Primary: Internal Medicine

## 2021-03-19 DIAGNOSIS — D571 Sickle-cell disease without crisis: Principal | ICD-10-CM

## 2021-03-19 DIAGNOSIS — D5700 Hb-SS disease with crisis, unspecified: Principal | ICD-10-CM

## 2021-03-19 DIAGNOSIS — R809 Proteinuria, unspecified: Principal | ICD-10-CM

## 2021-03-19 MED ORDER — OXYCODONE 10 MG TABLET
ORAL_TABLET | ORAL | 0 refills | 10 days | Status: CP | PRN
Start: 2021-03-19 — End: ?
  Filled 2021-03-19: qty 60, 10d supply, fill #0

## 2021-03-20 DIAGNOSIS — D7589 Other specified diseases of blood and blood-forming organs: Principal | ICD-10-CM

## 2021-03-20 DIAGNOSIS — D571 Sickle-cell disease without crisis: Principal | ICD-10-CM

## 2021-04-02 MED ORDER — HYDROXYUREA 500 MG CAPSULE
ORAL_CAPSULE | 1 refills | 0 days | Status: CP
Start: 2021-04-02 — End: ?

## 2021-04-09 DIAGNOSIS — D5700 Hb-SS disease with crisis, unspecified: Principal | ICD-10-CM

## 2021-04-10 MED ORDER — OXYCODONE 10 MG TABLET
ORAL_TABLET | ORAL | 0 refills | 10 days | Status: CP | PRN
Start: 2021-04-10 — End: ?

## 2021-04-26 DIAGNOSIS — D571 Sickle-cell disease without crisis: Principal | ICD-10-CM

## 2021-04-30 DIAGNOSIS — D571 Sickle-cell disease without crisis: Principal | ICD-10-CM

## 2021-04-30 DIAGNOSIS — D5700 Hb-SS disease with crisis, unspecified: Principal | ICD-10-CM

## 2021-04-30 MED ORDER — IBUPROFEN 400 MG TABLET
ORAL_TABLET | Freq: Three times a day (TID) | ORAL | 1 refills | 30.00000 days | Status: CP | PRN
Start: 2021-04-30 — End: ?

## 2021-04-30 MED ORDER — CHOLECALCIFEROL (VITAMIN D3) 25 MCG (1,000 UNIT) TABLET
ORAL_TABLET | Freq: Every day | ORAL | 3 refills | 90.00000 days | Status: CP
Start: 2021-04-30 — End: 2022-04-30

## 2021-04-30 MED ORDER — OXYCODONE 10 MG TABLET
ORAL_TABLET | ORAL | 0 refills | 10.00000 days | Status: CP | PRN
Start: 2021-04-30 — End: ?

## 2021-04-30 MED ORDER — HYDROXYUREA 500 MG CAPSULE
ORAL_CAPSULE | Freq: Every day | ORAL | 1 refills | 30 days | Status: CP
Start: 2021-04-30 — End: ?

## 2021-05-12 DIAGNOSIS — D571 Sickle-cell disease without crisis: Principal | ICD-10-CM

## 2021-05-21 ENCOUNTER — Ambulatory Visit: Admit: 2021-05-21 | Discharge: 2021-05-22 | Payer: PRIVATE HEALTH INSURANCE

## 2021-05-21 DIAGNOSIS — D571 Sickle-cell disease without crisis: Principal | ICD-10-CM

## 2021-05-21 MED ORDER — OXYCODONE 10 MG TABLET
ORAL_TABLET | ORAL | 0 refills | 10.00000 days | Status: CP | PRN
Start: 2021-05-21 — End: 2021-05-21

## 2021-05-24 ENCOUNTER — Ambulatory Visit: Admit: 2021-05-24 | Discharge: 2021-05-25 | Payer: PRIVATE HEALTH INSURANCE

## 2021-06-11 DIAGNOSIS — D5700 Hb-SS disease with crisis, unspecified: Principal | ICD-10-CM

## 2021-06-11 DIAGNOSIS — D571 Sickle-cell disease without crisis: Principal | ICD-10-CM

## 2021-06-11 MED ORDER — OXYCODONE 10 MG TABLET
ORAL_TABLET | ORAL | 0 refills | 10 days | Status: CP | PRN
Start: 2021-06-11 — End: ?

## 2021-06-11 MED ORDER — IBUPROFEN 400 MG TABLET
ORAL_TABLET | Freq: Three times a day (TID) | ORAL | 1 refills | 30 days | Status: CP | PRN
Start: 2021-06-11 — End: ?

## 2021-06-11 MED ORDER — HYDROXYUREA 500 MG CAPSULE
ORAL_CAPSULE | Freq: Every day | ORAL | 1 refills | 30.00000 days | Status: CP
Start: 2021-06-11 — End: ?

## 2021-06-13 MED ORDER — AMITRIPTYLINE 10 MG TABLET
ORAL_TABLET | ORAL | 0 refills | 0.00000 days | Status: CP
Start: 2021-06-13 — End: ?

## 2021-06-18 DIAGNOSIS — D571 Sickle-cell disease without crisis: Principal | ICD-10-CM

## 2021-06-29 DIAGNOSIS — D5700 Hb-SS disease with crisis, unspecified: Principal | ICD-10-CM

## 2021-06-29 MED ORDER — OXYCODONE 10 MG TABLET
ORAL_TABLET | ORAL | 0 refills | 10.00000 days | Status: CP | PRN
Start: 2021-06-29 — End: ?

## 2021-07-12 ENCOUNTER — Ambulatory Visit
Admit: 2021-07-12 | Discharge: 2021-07-13 | Payer: PRIVATE HEALTH INSURANCE | Attending: Internal Medicine | Primary: Internal Medicine

## 2021-07-12 ENCOUNTER — Ambulatory Visit: Admit: 2021-07-12 | Discharge: 2021-07-13 | Payer: PRIVATE HEALTH INSURANCE

## 2021-07-12 DIAGNOSIS — I729 Aneurysm of unspecified site: Principal | ICD-10-CM

## 2021-07-12 DIAGNOSIS — G43009 Migraine without aura, not intractable, without status migrainosus: Principal | ICD-10-CM

## 2021-07-12 DIAGNOSIS — D571 Sickle-cell disease without crisis: Principal | ICD-10-CM

## 2021-07-12 DIAGNOSIS — D5700 Hb-SS disease with crisis, unspecified: Principal | ICD-10-CM

## 2021-07-12 MED ORDER — OXYCODONE 10 MG TABLET
ORAL_TABLET | ORAL | 0 refills | 10.00000 days | Status: CP | PRN
Start: 2021-07-12 — End: ?

## 2021-07-16 DIAGNOSIS — D571 Sickle-cell disease without crisis: Principal | ICD-10-CM

## 2021-07-26 DIAGNOSIS — D5700 Hb-SS disease with crisis, unspecified: Principal | ICD-10-CM

## 2021-07-26 MED ORDER — OXYCODONE 10 MG TABLET
ORAL_TABLET | ORAL | 0 refills | 10 days | Status: CP | PRN
Start: 2021-07-26 — End: ?

## 2021-08-04 DIAGNOSIS — D571 Sickle-cell disease without crisis: Principal | ICD-10-CM

## 2021-08-10 DIAGNOSIS — D571 Sickle-cell disease without crisis: Principal | ICD-10-CM

## 2021-08-10 DIAGNOSIS — D5700 Hb-SS disease with crisis, unspecified: Principal | ICD-10-CM

## 2021-08-10 MED ORDER — AMITRIPTYLINE 10 MG TABLET
ORAL_TABLET | 0 refills | 0 days | Status: CP
Start: 2021-08-10 — End: ?

## 2021-08-10 MED ORDER — OXYCODONE 10 MG TABLET
ORAL_TABLET | ORAL | 0 refills | 10.00000 days | Status: CP | PRN
Start: 2021-08-10 — End: ?

## 2021-08-13 DIAGNOSIS — D571 Sickle-cell disease without crisis: Principal | ICD-10-CM

## 2021-08-23 DIAGNOSIS — D5700 Hb-SS disease with crisis, unspecified: Principal | ICD-10-CM

## 2021-08-23 MED ORDER — OXYCODONE 10 MG TABLET
ORAL_TABLET | ORAL | 0 refills | 10 days | Status: CP | PRN
Start: 2021-08-23 — End: ?

## 2021-09-01 DIAGNOSIS — D571 Sickle-cell disease without crisis: Principal | ICD-10-CM

## 2021-09-03 MED ORDER — HYDROXYUREA 500 MG CAPSULE
ORAL_CAPSULE | Freq: Every day | ORAL | 1 refills | 30.00000 days | Status: CP
Start: 2021-09-03 — End: ?

## 2021-09-06 ENCOUNTER — Telehealth: Admit: 2021-09-06 | Discharge: 2021-09-06 | Payer: PRIVATE HEALTH INSURANCE

## 2021-09-06 ENCOUNTER — Telehealth
Admit: 2021-09-06 | Discharge: 2021-09-06 | Payer: PRIVATE HEALTH INSURANCE | Attending: Internal Medicine | Primary: Internal Medicine

## 2021-09-06 DIAGNOSIS — D571 Sickle-cell disease without crisis: Principal | ICD-10-CM

## 2021-09-06 DIAGNOSIS — R809 Proteinuria, unspecified: Principal | ICD-10-CM

## 2021-09-10 DIAGNOSIS — D571 Sickle-cell disease without crisis: Principal | ICD-10-CM

## 2021-09-14 DIAGNOSIS — D5700 Hb-SS disease with crisis, unspecified: Principal | ICD-10-CM

## 2021-09-14 MED ORDER — OXYCODONE 10 MG TABLET
ORAL_TABLET | ORAL | 0 refills | 10.00000 days | Status: CP | PRN
Start: 2021-09-14 — End: ?

## 2021-09-19 ENCOUNTER — Ambulatory Visit: Admit: 2021-09-19 | Discharge: 2021-09-20 | Payer: PRIVATE HEALTH INSURANCE

## 2021-09-19 DIAGNOSIS — D571 Sickle-cell disease without crisis: Principal | ICD-10-CM

## 2021-10-02 DIAGNOSIS — D5700 Hb-SS disease with crisis, unspecified: Principal | ICD-10-CM

## 2021-10-02 MED ORDER — OXYCODONE 10 MG TABLET
ORAL_TABLET | Freq: Four times a day (QID) | ORAL | 0 refills | 8.00000 days | Status: CP | PRN
Start: 2021-10-02 — End: ?

## 2021-10-07 DIAGNOSIS — D571 Sickle-cell disease without crisis: Principal | ICD-10-CM

## 2021-10-08 DIAGNOSIS — D571 Sickle-cell disease without crisis: Principal | ICD-10-CM

## 2021-10-11 MED ORDER — HYDROXYUREA 500 MG CAPSULE
ORAL_CAPSULE | Freq: Every day | ORAL | 1 refills | 30.00000 days | Status: CP
Start: 2021-10-11 — End: ?

## 2021-10-18 DIAGNOSIS — D571 Sickle-cell disease without crisis: Principal | ICD-10-CM

## 2021-10-18 DIAGNOSIS — D5700 Hb-SS disease with crisis, unspecified: Principal | ICD-10-CM

## 2021-10-18 MED ORDER — AMITRIPTYLINE 10 MG TABLET
ORAL_TABLET | Freq: Every evening | ORAL | 0 refills | 90.00000 days | Status: CP
Start: 2021-10-18 — End: ?

## 2021-10-18 MED ORDER — HYDROXYUREA 500 MG CAPSULE
ORAL_CAPSULE | Freq: Every day | ORAL | 0 refills | 30.00000 days | Status: CP
Start: 2021-10-18 — End: ?

## 2021-10-18 MED ORDER — CHOLECALCIFEROL (VITAMIN D3) 25 MCG (1,000 UNIT) TABLET
ORAL_TABLET | Freq: Every day | ORAL | 3 refills | 90 days | Status: CP
Start: 2021-10-18 — End: 2022-10-18

## 2021-10-18 MED ORDER — OXYCODONE 10 MG TABLET
ORAL_TABLET | Freq: Four times a day (QID) | ORAL | 0 refills | 8.00000 days | Status: CP | PRN
Start: 2021-10-18 — End: ?

## 2021-10-18 MED ORDER — IBUPROFEN 400 MG TABLET
ORAL_TABLET | Freq: Three times a day (TID) | ORAL | 1 refills | 30 days | Status: CP | PRN
Start: 2021-10-18 — End: ?

## 2021-10-25 ENCOUNTER — Ambulatory Visit: Admit: 2021-10-25 | Discharge: 2021-10-25 | Payer: PRIVATE HEALTH INSURANCE

## 2021-10-25 DIAGNOSIS — D5700 Hb-SS disease with crisis, unspecified: Principal | ICD-10-CM

## 2021-10-25 DIAGNOSIS — D571 Sickle-cell disease without crisis: Principal | ICD-10-CM

## 2021-10-25 MED ORDER — HYDROMORPHONE 4 MG TABLET
ORAL_TABLET | Freq: Four times a day (QID) | ORAL | 0 refills | 14 days | Status: CP | PRN
Start: 2021-10-25 — End: 2021-10-25

## 2021-10-25 MED ORDER — OXYCODONE 10 MG TABLET
ORAL_TABLET | Freq: Four times a day (QID) | ORAL | 0 refills | 15.00000 days | Status: CP | PRN
Start: 2021-10-25 — End: ?
  Filled 2021-10-25: qty 60, 15d supply, fill #0

## 2021-10-25 MED ORDER — LIDOCAINE 5 % TOPICAL PATCH
MEDICATED_PATCH | Freq: Two times a day (BID) | TRANSDERMAL | 0 refills | 5.00000 days | Status: CP
Start: 2021-10-25 — End: 2022-10-25

## 2021-10-29 DIAGNOSIS — D571 Sickle-cell disease without crisis: Principal | ICD-10-CM

## 2021-10-29 DIAGNOSIS — R809 Proteinuria, unspecified: Principal | ICD-10-CM

## 2021-11-05 DIAGNOSIS — D571 Sickle-cell disease without crisis: Principal | ICD-10-CM

## 2021-11-09 DIAGNOSIS — D571 Sickle-cell disease without crisis: Principal | ICD-10-CM

## 2021-11-13 DIAGNOSIS — D5700 Hb-SS disease with crisis, unspecified: Principal | ICD-10-CM

## 2021-11-13 MED ORDER — OXYCODONE 10 MG TABLET
ORAL_TABLET | Freq: Four times a day (QID) | ORAL | 0 refills | 15 days | Status: CP | PRN
Start: 2021-11-13 — End: ?

## 2021-12-03 DIAGNOSIS — D571 Sickle-cell disease without crisis: Principal | ICD-10-CM

## 2021-12-04 DIAGNOSIS — D5700 Hb-SS disease with crisis, unspecified: Principal | ICD-10-CM

## 2021-12-04 MED ORDER — OXYCODONE 10 MG TABLET
ORAL_TABLET | Freq: Four times a day (QID) | ORAL | 0 refills | 15 days | Status: CP | PRN
Start: 2021-12-04 — End: ?

## 2021-12-06 DIAGNOSIS — D571 Sickle-cell disease without crisis: Principal | ICD-10-CM

## 2021-12-13 MED ORDER — HYDROXYUREA 500 MG CAPSULE
ORAL_CAPSULE | 1 refills | 0 days | Status: CP
Start: 2021-12-13 — End: ?

## 2021-12-20 DIAGNOSIS — D5700 Hb-SS disease with crisis, unspecified: Principal | ICD-10-CM

## 2021-12-20 MED ORDER — IBUPROFEN 400 MG TABLET
ORAL_TABLET | Freq: Three times a day (TID) | ORAL | 1 refills | 30 days | Status: CP | PRN
Start: 2021-12-20 — End: ?

## 2021-12-20 MED ORDER — OXYCODONE 10 MG TABLET
ORAL_TABLET | Freq: Four times a day (QID) | ORAL | 0 refills | 15 days | Status: CP | PRN
Start: 2021-12-20 — End: ?

## 2021-12-24 DIAGNOSIS — R809 Proteinuria, unspecified: Principal | ICD-10-CM

## 2021-12-24 DIAGNOSIS — D571 Sickle-cell disease without crisis: Principal | ICD-10-CM

## 2021-12-31 DIAGNOSIS — D571 Sickle-cell disease without crisis: Principal | ICD-10-CM

## 2022-01-03 ENCOUNTER — Ambulatory Visit: Admit: 2022-01-03 | Discharge: 2022-01-04 | Payer: PRIVATE HEALTH INSURANCE

## 2022-01-03 DIAGNOSIS — D571 Sickle-cell disease without crisis: Principal | ICD-10-CM

## 2022-01-03 MED ORDER — HYDROMORPHONE 4 MG TABLET
ORAL_TABLET | ORAL | 0 refills | 5 days | Status: CP | PRN
Start: 2022-01-03 — End: 2023-01-03
  Filled 2022-01-03: qty 30, 5d supply, fill #0
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
## 2022-01-04 MED ORDER — EPOETIN ALFA 10,000 UNIT/ML INJECTION SOLUTION
SUBCUTANEOUS | 2 refills | 28 days | Status: CP
Start: 2022-01-04 — End: ?
  Filled 2022-02-21: qty 4, 28d supply, fill #0

## 2022-01-04 MED ORDER — BD TUBERCULIN SYRINGE 1 ML 27 X 1/2"
2 refills | 0.00000 days | Status: CP
Start: 2022-01-04 — End: ?
  Filled 2022-02-21: qty 4, 28d supply, fill #0

## 2022-01-08 DIAGNOSIS — D5700 Hb-SS disease with crisis, unspecified: Principal | ICD-10-CM

## 2022-01-10 DIAGNOSIS — D5700 Hb-SS disease with crisis, unspecified: Principal | ICD-10-CM

## 2022-01-10 MED ORDER — OXYCODONE 10 MG TABLET
ORAL_TABLET | Freq: Four times a day (QID) | ORAL | 0 refills | 15.00000 days | Status: CP | PRN
Start: 2022-01-10 — End: ?

## 2022-01-12 DIAGNOSIS — D571 Sickle-cell disease without crisis: Principal | ICD-10-CM

## 2022-01-28 DIAGNOSIS — D571 Sickle-cell disease without crisis: Principal | ICD-10-CM

## 2022-01-29 DIAGNOSIS — D5700 Hb-SS disease with crisis, unspecified: Principal | ICD-10-CM

## 2022-01-29 MED ORDER — OXYCODONE 10 MG TABLET
ORAL_TABLET | Freq: Four times a day (QID) | ORAL | 0 refills | 15 days | Status: CP | PRN
Start: 2022-01-29 — End: ?

## 2022-01-29 MED ORDER — FOLIC ACID 1 MG TABLET
ORAL_TABLET | 1 refills | 0 days | Status: CP
Start: 2022-01-29 — End: ?

## 2022-02-07 NOTE — Unmapped (Signed)
[facility] Specialty Medication Onboarding    Specialty Medication: Procrit  Prior Authorization: Approved   Financial Assistance: No - copay  <$25  Final Copay/Day Supply: $0 / 28    Insurance Restrictions: None     Notes to Pharmacist:     The triage team has completed the benefits investigation and has determined that the patient is able to fill this medication at [facility]. Please contact the patient to complete the onboarding or follow up with the prescribing physician as needed.

## 2022-02-07 NOTE — Unmapped (Signed)
[facility]   Patient Onboarding/Medication Counseling    Warren Miles is a 36 y.o. male with Sickle cell disease who I am counseling today on initiation of therapy.  I am speaking to the patient.    Was a Nurse, learning disability used for this call? No    Verified patient's date of birth / HIPAA.    Specialty medication(s) to be sent: Hematology/Oncology: Procrit      Non-specialty medications/supplies to be sent: Syringes, sharps container      Medications not needed at this time: n/a         Procrit (Epoetin Alfa)    Medication & Administration     Dosage: 1ml under the skin once a week     Administration:   ??? Do not shake bottle  ??? To inject subcutaneously:  1. Remove vial from the refrigerator and check expiration date (keep away from direct light)  2. Gather supplies (vial, syringe w/needle, alcohol wipes, sharps container, cotton ball)  3. Wash hands with soap and water before preparing medication  4. Flip off the protective color cap on the top of the vial (do not remove grey rubber stopper)  5. Wipe the top of the grey stopper with an alcohol wipe  6. Open package with syringe/needle and make sure it is not damaged. Then remove needle cover and draw air into the syringe by pulling back on the plunger (amount of air should equal the amount of medication to be injected)  7. Place medication vial on flat surface and insert needle straight down through grey stopper.  8. Push the plunger of the syringe down to inject the air from the syringe into the vial.  9. Keep the needle inside the vial and turn the vial upside down with tip of needle in the liquid.    10. Slowly pull back on the plunger to fill the syringe with medication to the number of mL matching the dose prescribed  11. Keep needle in the vial and check for air bubbles (small amount of air is okay).  To remove large air bubbles, gently tap the syringe with your fingers until bubbles rise to the top.  Slowly push the plunger up to force the air out of the syringe.  Keep the tip of the needle in the liquid.    12. Pull the plunger back to the number that matches your dose.  Repeat steps until all large air bubbles are gone.  13. Make sure dose is correct, and lay the vial down on its side with needle still inside vial  14. Select and clean injection site (abdomen -2 inches away from belly button, front of middle thighs, outer area of upper arms, or upper outer area of buttocks) with alcohol wipe and let dry. Always rotate injection sites and make sure no sores, bruises, tenderness  15. Remove syringe from vial and hold in hand you will use to inject and hold it like a pencil  16. With other hand, pinch a fold of skin at the cleaned injection site  17. With a quick dart like motion insert the needle straight up and down (90 degree angle) or at a 45 degree angle into the skin.  Release the skin and pull plunger back slightly.  (If any blood comes into the syringe, do not inject. Discard that syringe in a sharps container and prepare new syringe) if no blood enters syringe then push plunger all the way down.    18. Pull needle  out of the skin and press cotton ball for several seconds  19. Do not recap needle and immediately dispose in sharps container.     Adherence/Missed dose instructions:  ??? Call provider    Goals of Therapy     To normalize red blood cell counts    Side Effects & Monitoring Parameters     ??? Common side effects  ??? Irritation at injection site  ??? High blood pressure  ??? Nausea or vomiting  ??? Itching  or rash  ??? Fever or cough  ??? Headache or dizziness  ??? Bone, joint, or muscle pain or muscle spasm  ??? Mouth irritation or sores    ??? The following side effects should be reported to the provider:  ??? Allergic reaction (rash, hives, itching, difficulty breathing or swelling)  ??? High blood pressure (dizziness, headache, change in vision)  ??? High blood sugar (confusion, increased thirst, hunger, urination, fruity breath)  ??? Low potassium level (muscle pain/weakness or cramps; abnormal heartbeat)  ??? Shortness of breath or swelling, warmth, numbness or change in color or pain in legs or arms  ??? Weakness on one side of the body, trouble speaking or thinking, change in balance, drooping on one side of face, blurred eyesight  ??? Red, swollen, blistered, or peeling of skin, red or irritated eyes, sores in mouth, throat, nose, or eyes    ??? Monitoring Parameters  ??? Transferrin saturation and serum ferritin  ??? Hemoglobin  ??? Blood pressure    Contraindications, Warnings, & Precautions     ??? BBW: cardiovascular event - MI, stroke, VTE, vascular access thrombosis, and mortality  ??? BBW: a shortened overall survival and/or increased risk of tumor progression or recurrence in patients with cancer. Use lowest dose needed to avoid RBC transfusions.Use this medicine in cancer patients only for the treatment of anemia related to concurrent myelosuppressive chemotherapy and discontinue following completion of the chemotherapy course. This medicine is not indicated for patients receiving myelosuppressive therapy when the anticipated outcome is curative.  ??? BBW: an increased risk of death, serious cardiovascular events, and stroke in chronic kidney disease patients. Use the lowest dose sufficient to reduce the need for RBC transfusions.  ??? BBW: Deep vein thrombosis prophylaxis is recommended in perisurgery patients due to the increased risk of DVT  ??? Erythema multiforme and Stevens-Johnson syndrome, toxic epidermal necrolysis  ??? Hypersensitivity  ??? Pure red cell aplasia, predominately in patients with chronic kidney disease  ??? Hypertension  ??? Seizures  ??? Severe anemia or acute blood loss    Drug/Food Interactions     ??? Medication list reviewed in Epic. The patient was instructed to inform the care team before taking any new medications or supplements. No drug interactions identified.     Storage, Handling Precautions, & Disposal     ??? Store in refrigerator  ??? Protect from light  ??? Keep away from children and pets  ??? Dispose of used needles in a sharps container      Current Medications (including OTC/herbals), Comorbidities and Allergies     Current Outpatient Medications   Medication Sig Dispense Refill   ??? acetaminophen (TYLENOL) 500 MG tablet Take 2 tablets (1,000 mg total) by mouth every eight (8) hours. 30 tablet 1   ??? amitriptyline (ELAVIL) 10 MG tablet Take 2 tablets (20 mg total) by mouth nightly. 180 tablet 0   ??? cholecalciferol, vitamin D3 25 mcg, 1,000 units,, 1,000 unit (25 mcg) tablet Take 1 tablet (25 mcg total) by  mouth daily. 90 tablet 3   ??? cholecalciferol, vitamin D3-25 mcg, 1,000 unit,, 25 mcg (1,000 unit) capsule Take 25 mcg by mouth daily.     ??? epoetin alfa (PROCRIT) 10,000 unit/mL injection Inject 1 mL (10,000 Units total) under the skin once a week. 4 mL 2   ??? folic acid (FOLVITE) 1 MG tablet TAKE 1 TABLET BY MOUTH EVERY DAY 90 tablet 1   ??? HYDROmorphone (DILAUDID) 4 MG tablet Take 1 tablet (4 mg total) by mouth every four (4) hours as needed for moderate pain. 30 tablet 0   ??? hydroxyurea (HYDREA) 500 mg capsule TAKE 2 CAPSULES (1,000 MG TOTAL) BY MOUTH IN THE MORNING. 60 capsule 1   ??? ibuprofen (MOTRIN) 400 MG tablet TAKE 1 TABLET (400 MG TOTAL) BY MOUTH EVERY EIGHT (8) HOURS AS NEEDED FOR PAIN. 90 tablet 1   ??? lidocaine (LIDODERM) 5 % patch Place 1 patch on the skin to affected area for 12 hours only each day (then remove patch) 10 patch 0   ??? metoprolol succinate (TOPROL-XL) 25 MG 24 hr tablet Take 1 tablet (25 mg total) by mouth nightly. 30 tablet 11   ??? oxyCODONE (ROXICODONE) 10 mg immediate release tablet Take 1 tablet (10 mg total) by mouth every six (6) hours as needed for pain (use for severe uncontrolled pain; try tylenol and ibuprofen first). 60 tablet 0   ??? syringe (BD TUBERCULIN SYRINGE) 1 mL 27 x 1/2 Syrg 1 each by Miscellaneous route once a week. For procrit injections 25 each 2     No current facility-administered medications for this visit.       No Known Allergies    Patient Active Problem List   Diagnosis   ??? Sickle cell anemia (CMS-HCC)   ??? Tobacco use disorder   ??? Nocturnal hypoxia   ??? Migraine without aura and without status migrainosus, not intractable   ??? Dental caries       Reviewed and up to date in Epic.    Appropriateness of Therapy     Acute infections noted within Epic:  No active infections  Patient reported infection: None    Is medication and dose appropriate based on diagnosis and infection status? Yes    Prescription has been clinically reviewed: Yes      Baseline Quality of Life Assessment      How many days over the past month did your condition  keep you from your normal activities? For example, brushing your teeth or getting up in the morning. 0    Financial Information     Medication Assistance provided: Prior Authorization    Anticipated copay of $0 reviewed with patient. Verified delivery address.    Delivery Information     Scheduled delivery date: 02/22/22    Expected start date: ~3 weeks (03/23)    Medication will be delivered via Same Day Courier to the prescription address in [facility].  This shipment will not require a signature.      Explained the services we provide at [facility] and that each month we would call to set up refills.  Stressed importance of returning phone calls so that we could ensure they receive their medications in time each month.  Informed patient that we should be setting up refills 7-10 days prior to when they will run out of medication.  A pharmacist will reach out to perform a clinical assessment periodically.  Informed patient that a welcome packet, containing information about  our pharmacy and other support services, a Notice of Privacy Practices, and a drug information handout will be sent.      The patient or caregiver noted above participated in the development of this care plan and knows that they can request review of or adjustments to the care plan at any time.      Patient or caregiver verbalized understanding of the above information as well as how to contact the pharmacy at [phone] option 4 with any questions/concerns.  The pharmacy is open Monday through Friday 8:30am-4:30pm.  A pharmacist is available 24/7 via pager to answer any clinical questions they may have.    Patient Specific Needs     - Does the patient have any physical, cognitive, or cultural barriers? No    - Does the patient have adequate living arrangements? (i.e. the ability to store and take their medication appropriately) Yes    - Did you identify any home environmental safety or security hazards? No    - Patient prefers to have medications discussed with  Patient     - Is the patient or caregiver able to read and understand education materials at a high school level or above? Yes    - Patient's primary language is  English     - Is the patient high risk? No    SOCIAL DETERMINANTS OF HEALTH     At the [facility], we have learned that life circumstances - like trouble affording food, housing, utilities, or transportation can affect the health of many of our patients.   That is why we wanted to ask: are you currently experiencing any life circumstances that are negatively impacting your health and/or quality of life? No    Social Determinants of Health     Food Insecurity: Not on file   Tobacco Use: High Risk   ??? Smoking Tobacco Use: Some Days   ??? Smokeless Tobacco Use: Never   ??? Passive Exposure: Not on file   Transportation Needs: Not on file   Alcohol Use: Not on file   Housing/Utilities: Not on file   Substance Use: Not on file   Financial Resource Strain: Not on file   Physical Activity: Not on file   Health Literacy: Not on file   Stress: Not on file   Intimate Partner Violence: Not on file   Depression: Not on file   Social Connections: Not on file       Would you be willing to receive help with any of the needs that you have identified today? Not applicable       Liliann File Vangie Bicker  [facility] Specialty Pharmacist

## 2022-02-11 NOTE — Unmapped (Signed)
Patient contacted clinic to request pain medication refill. States he just got out of hospital. [facility] [address], [place], [place]

## 2022-02-12 DIAGNOSIS — D5700 Hb-SS disease with crisis, unspecified: Principal | ICD-10-CM

## 2022-02-12 MED ORDER — OXYCODONE 10 MG TABLET
ORAL_TABLET | Freq: Four times a day (QID) | ORAL | 0 refills | 15 days | Status: CP | PRN
Start: 2022-02-12 — End: ?

## 2022-02-18 DIAGNOSIS — D571 Sickle-cell disease without crisis: Principal | ICD-10-CM

## 2022-02-18 DIAGNOSIS — R809 Proteinuria, unspecified: Principal | ICD-10-CM

## 2022-02-25 DIAGNOSIS — D571 Sickle-cell disease without crisis: Principal | ICD-10-CM

## 2022-02-25 DIAGNOSIS — D5700 Hb-SS disease with crisis, unspecified: Principal | ICD-10-CM

## 2022-02-25 MED ORDER — FOLIC ACID 1 MG TABLET
ORAL_TABLET | Freq: Every day | ORAL | 1 refills | 90 days
Start: 2022-02-25 — End: ?

## 2022-02-25 MED ORDER — OXYCODONE 10 MG TABLET
ORAL_TABLET | Freq: Four times a day (QID) | ORAL | 0 refills | 15 days | PRN
Start: 2022-02-25 — End: ?

## 2022-02-26 DIAGNOSIS — D5700 Hb-SS disease with crisis, unspecified: Principal | ICD-10-CM

## 2022-02-26 MED ORDER — FOLIC ACID 1 MG TABLET
ORAL_TABLET | Freq: Every day | ORAL | 1 refills | 90 days
Start: 2022-02-26 — End: ?

## 2022-02-26 MED ORDER — OXYCODONE 10 MG TABLET
ORAL_TABLET | Freq: Four times a day (QID) | ORAL | 0 refills | 15.00000 days | Status: CP | PRN
Start: 2022-02-26 — End: ?

## 2022-02-28 DIAGNOSIS — D571 Sickle-cell disease without crisis: Principal | ICD-10-CM

## 2022-02-28 MED ORDER — AMITRIPTYLINE 10 MG TABLET
ORAL_TABLET | 0 refills | 0 days | Status: CP
Start: 2022-02-28 — End: ?

## 2022-03-14 ENCOUNTER — Ambulatory Visit: Admit: 2022-03-14 | Discharge: 2022-03-15 | Payer: PRIVATE HEALTH INSURANCE

## 2022-03-14 DIAGNOSIS — D571 Sickle-cell disease without crisis: Principal | ICD-10-CM

## 2022-03-14 DIAGNOSIS — D5700 Hb-SS disease with crisis, unspecified: Principal | ICD-10-CM

## 2022-03-14 LAB — CBC W/ AUTO DIFF
BASOPHILS ABSOLUTE COUNT: 0.1 10*9/L (ref 0.0–0.1)
BASOPHILS RELATIVE PERCENT: 1.1 %
EOSINOPHILS ABSOLUTE COUNT: 0.1 10*9/L (ref 0.0–0.5)
EOSINOPHILS RELATIVE PERCENT: 1.1 %
HEMATOCRIT: 28 % — ABNORMAL LOW (ref 39.0–48.0)
HEMOGLOBIN: 9.7 g/dL — ABNORMAL LOW (ref 12.9–16.5)
LYMPHOCYTES ABSOLUTE COUNT: 3.3 10*9/L (ref 1.1–3.6)
LYMPHOCYTES RELATIVE PERCENT: 31.4 %
MEAN CORPUSCULAR HEMOGLOBIN CONC: 34.8 g/dL (ref 32.0–36.0)
MEAN CORPUSCULAR HEMOGLOBIN: 30.1 pg (ref 25.9–32.4)
MEAN CORPUSCULAR VOLUME: 86.3 fL (ref 77.6–95.7)
MEAN PLATELET VOLUME: 8.5 fL (ref 6.8–10.7)
MONOCYTES ABSOLUTE COUNT: 1.2 10*9/L — ABNORMAL HIGH (ref 0.3–0.8)
MONOCYTES RELATIVE PERCENT: 11.7 %
NEUTROPHILS ABSOLUTE COUNT: 5.7 10*9/L (ref 1.8–7.8)
NEUTROPHILS RELATIVE PERCENT: 54.7 %
NUCLEATED RED BLOOD CELLS: 0 /100{WBCs} (ref <=4)
PLATELET COUNT: 274 10*9/L (ref 150–450)
RED BLOOD CELL COUNT: 3.24 10*12/L — ABNORMAL LOW (ref 4.26–5.60)
RED CELL DISTRIBUTION WIDTH: 23.7 % — ABNORMAL HIGH (ref 12.2–15.2)
WBC ADJUSTED: 10.4 10*9/L (ref 3.6–11.2)

## 2022-03-14 LAB — C-REACTIVE PROTEIN: C-REACTIVE PROTEIN: <4 mg/L (ref <=10.0)

## 2022-03-14 LAB — URINALYSIS WITH MICROSCOPY WITH CULTURE REFLEX
BILIRUBIN UA: NEGATIVE
BLOOD UA: NEGATIVE
GLUCOSE UA: NEGATIVE
KETONES UA: NEGATIVE
LEUKOCYTE ESTERASE UA: NEGATIVE
NITRITE UA: NEGATIVE
PH UA: 6 (ref 5.0–9.0)
PROTEIN UA: NEGATIVE
RBC UA: <1 /HPF (ref 0–3)
SPECIFIC GRAVITY UA: 1.01 (ref 1.005–1.030)
SQUAMOUS EPITHELIAL: <1 /HPF (ref 0–5)
UROBILINOGEN UA: 1
WBC UA: 3 /HPF (ref 0–3)

## 2022-03-14 LAB — IRON & TIBC
IRON SATURATION: 48 % (ref 20–55)
IRON: 129 ug/dL
TOTAL IRON BINDING CAPACITY: 267 ug/dL (ref 250–425)

## 2022-03-14 LAB — BASIC METABOLIC PANEL
ANION GAP: 5 mmol/L (ref 5–14)
BLOOD UREA NITROGEN: 10 mg/dL (ref 9–23)
BUN / CREAT RATIO: 12
CALCIUM: 9.8 mg/dL (ref 8.7–10.4)
CHLORIDE: 110 mmol/L — ABNORMAL HIGH (ref 98–107)
CO2: 25.3 mmol/L (ref 20.0–31.0)
CREATININE: 0.85 mg/dL
EGFR CKD-EPI (2021) MALE: >90 mL/min/{1.73_m2} (ref >=60)
GLUCOSE RANDOM: 98 mg/dL (ref 70–179)
POTASSIUM: 4 mmol/L (ref 3.4–4.8)
SODIUM: 140 mmol/L (ref 135–145)

## 2022-03-14 LAB — HEPATIC FUNCTION PANEL
ALBUMIN: 4.5 g/dL (ref 3.4–5.0)
ALKALINE PHOSPHATASE: 91 U/L (ref 46–116)
ALT (SGPT): 25 U/L (ref 10–49)
AST (SGOT): 33 U/L (ref <=34)
BILIRUBIN DIRECT: 0.6 mg/dL — ABNORMAL HIGH (ref 0.00–0.30)
BILIRUBIN TOTAL: 1.6 mg/dL — ABNORMAL HIGH (ref 0.3–1.2)
PROTEIN TOTAL: 7.7 g/dL (ref 5.7–8.2)

## 2022-03-14 LAB — SLIDE REVIEW

## 2022-03-14 LAB — RETICULOCYTES
RETICULOCYTE ABSOLUTE COUNT: 151 10*9/L — ABNORMAL HIGH (ref 23.0–100.0)
RETICULOCYTE COUNT PCT: 4.66 % — ABNORMAL HIGH (ref 0.50–2.17)

## 2022-03-14 LAB — FERRITIN: FERRITIN: 706.7 ng/mL — ABNORMAL HIGH

## 2022-03-14 LAB — LACTATE DEHYDROGENASE: LACTATE DEHYDROGENASE: 410 U/L — ABNORMAL HIGH (ref 120–246)

## 2022-03-14 MED ORDER — OXYCODONE 10 MG TABLET
ORAL_TABLET | Freq: Four times a day (QID) | ORAL | 0 refills | 30.00000 days | Status: CP | PRN
Start: 2022-03-14 — End: 2022-03-14
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
## 2022-03-15 DIAGNOSIS — D5700 Hb-SS disease with crisis, unspecified: Principal | ICD-10-CM

## 2022-04-02 NOTE — Unmapped (Signed)
Clinical Social Work Note  [facility]    Referral source: Dessie Coma, CMA    This SW called [employer] to determine what times they provide transportation because Warren Miles has upcoming appointments with the chronic health clinic lasting until 5 pm. The representative shared that they provide transportation from 7 am - 7 pm. [name]' appointments are in July, so it is too early to schedule transportation.       Warren Miles, CCM    [facility] Care Management  Outpatient Social Worker  Phone: [phone]  Pager: [phone]

## 2022-04-05 DIAGNOSIS — D571 Sickle-cell disease without crisis: Principal | ICD-10-CM

## 2022-04-05 MED ORDER — HYDROXYUREA 500 MG CAPSULE
ORAL_CAPSULE | 1 refills | 0 days
Start: 2022-04-05 — End: ?

## 2022-04-08 DIAGNOSIS — D571 Sickle-cell disease without crisis: Principal | ICD-10-CM

## 2022-04-08 NOTE — Unmapped (Signed)
Patient says he has not started Epoetin yet.  His last labwork was within range and MD wanted to hold off on starting.  He still has the shipment sent in March and will call [facility] once needed.

## 2022-04-08 NOTE — Unmapped (Signed)
Spoke with paitent. Pt will get labs today or tomorrow @ [facility].   Standing orders placed.  Christella Scheuermann

## 2022-04-08 NOTE — Unmapped (Signed)
Called patient as a reminder that hydroxyurea safety labs are due for refill of medication.  Pt states he will go today, I placed standing orders for [facility]. (Cbc & reticulocytes).

## 2022-04-09 DIAGNOSIS — D571 Sickle-cell disease without crisis: Principal | ICD-10-CM

## 2022-04-09 DIAGNOSIS — D5700 Hb-SS disease with crisis, unspecified: Principal | ICD-10-CM

## 2022-04-09 LAB — CBC W/ DIFFERENTIAL
BANDED NEUTROPHILS ABSOLUTE COUNT: 0.1 10*3/uL (ref 0.0–0.1)
BASOPHILS ABSOLUTE COUNT: 0.1 10*3/uL (ref 0.0–0.2)
BASOPHILS RELATIVE PERCENT: 1 %
EOSINOPHILS ABSOLUTE COUNT: 0.2 10*3/uL (ref 0.0–0.4)
EOSINOPHILS RELATIVE PERCENT: 1 %
HEMATOCRIT: 24.2 % — ABNORMAL LOW (ref 37.5–51.0)
HEMOGLOBIN: 8.5 g/dL — ABNORMAL LOW (ref 13.0–17.7)
IMMATURE GRANULOCYTES: 1 %
LYMPHOCYTES ABSOLUTE COUNT: 4.7 10*3/uL — ABNORMAL HIGH (ref 0.7–3.1)
LYMPHOCYTES RELATIVE PERCENT: 33 %
MEAN CORPUSCULAR HEMOGLOBIN CONC: 35.1 g/dL (ref 31.5–35.7)
MEAN CORPUSCULAR HEMOGLOBIN: 30.4 pg (ref 26.6–33.0)
MEAN CORPUSCULAR VOLUME: 86 fL (ref 79–97)
MONOCYTES ABSOLUTE COUNT: 1.8 10*3/uL — ABNORMAL HIGH (ref 0.1–0.9)
MONOCYTES RELATIVE PERCENT: 13 %
NEUTROPHILS ABSOLUTE COUNT: 7.4 10*3/uL — ABNORMAL HIGH (ref 1.4–7.0)
NEUTROPHILS RELATIVE PERCENT: 51 %
NUCLEATED RED BLOOD CELLS: 1 % — ABNORMAL HIGH (ref 0–0)
PLATELET COUNT: 217 10*3/uL (ref 150–450)
RED BLOOD CELL COUNT: 2.8 x10E6/uL — ABNORMAL LOW (ref 4.14–5.80)
RED CELL DISTRIBUTION WIDTH: 22 % — ABNORMAL HIGH (ref 11.6–15.4)
WHITE BLOOD CELL COUNT: 14.3 10*3/uL — ABNORMAL HIGH (ref 3.4–10.8)

## 2022-04-09 LAB — RETICULOCYTES: RETICULOCYTE COUNT PCT: 8.9 % — ABNORMAL HIGH (ref 0.6–2.6)

## 2022-04-09 MED ORDER — OXYCODONE 10 MG TABLET
ORAL_TABLET | Freq: Four times a day (QID) | ORAL | 0 refills | 30.00000 days | PRN
Start: 2022-04-09 — End: 2022-05-09

## 2022-04-09 MED ORDER — HYDROXYUREA 500 MG CAPSULE
ORAL_CAPSULE | 1 refills | 0.00000 days
Start: 2022-04-09 — End: ?

## 2022-04-09 MED ORDER — IBUPROFEN 400 MG TABLET
ORAL_TABLET | Freq: Three times a day (TID) | ORAL | 1 refills | 30.00000 days | PRN
Start: 2022-04-09 — End: ?

## 2022-04-09 MED ORDER — METOPROLOL SUCCINATE ER 25 MG TABLET,EXTENDED RELEASE 24 HR
ORAL_TABLET | Freq: Every evening | ORAL | 11 refills | 30.00000 days
Start: 2022-04-09 — End: 2023-04-09

## 2022-04-10 MED ORDER — IBUPROFEN 400 MG TABLET
ORAL_TABLET | Freq: Three times a day (TID) | ORAL | 1 refills | 30.00000 days | Status: CP | PRN
Start: 2022-04-10 — End: ?

## 2022-04-10 MED ORDER — METOPROLOL SUCCINATE ER 25 MG TABLET,EXTENDED RELEASE 24 HR
ORAL_TABLET | Freq: Every evening | ORAL | 1 refills | 30 days | Status: CP
Start: 2022-04-10 — End: 2023-04-10

## 2022-04-10 MED ORDER — HYDROXYUREA 500 MG CAPSULE
ORAL_CAPSULE | Freq: Every day | ORAL | 1 refills | 0.00000 days | Status: CP
Start: 2022-04-10 — End: ?

## 2022-04-10 MED ORDER — OXYCODONE 10 MG TABLET
ORAL_TABLET | Freq: Four times a day (QID) | ORAL | 0 refills | 30.00000 days | Status: CP | PRN
Start: 2022-04-10 — End: 2022-05-10

## 2022-04-10 NOTE — Unmapped (Signed)
Pt notified, thank you.  Warren Miles

## 2022-04-15 DIAGNOSIS — D571 Sickle-cell disease without crisis: Principal | ICD-10-CM

## 2022-04-15 DIAGNOSIS — R809 Proteinuria, unspecified: Principal | ICD-10-CM

## 2022-04-30 DIAGNOSIS — D5700 Hb-SS disease with crisis, unspecified: Principal | ICD-10-CM

## 2022-04-30 MED ORDER — OXYCODONE 10 MG TABLET
ORAL_TABLET | Freq: Four times a day (QID) | ORAL | 0 refills | 30 days | PRN
Start: 2022-04-30 — End: 2022-05-30

## 2022-05-01 ENCOUNTER — Ambulatory Visit: Admit: 2022-05-01 | Discharge: 2022-05-02 | Payer: PRIVATE HEALTH INSURANCE

## 2022-05-01 DIAGNOSIS — D571 Sickle-cell disease without crisis: Principal | ICD-10-CM

## 2022-05-01 LAB — CBC W/ AUTO DIFF
BASOPHILS ABSOLUTE COUNT: 0.1 10*9/L (ref 0.0–0.1)
BASOPHILS RELATIVE PERCENT: 0.7 %
EOSINOPHILS ABSOLUTE COUNT: 0.1 10*9/L (ref 0.0–0.5)
EOSINOPHILS RELATIVE PERCENT: 0.9 %
HEMATOCRIT: 23.3 % — ABNORMAL LOW (ref 39.0–48.0)
HEMOGLOBIN: 8.2 g/dL — ABNORMAL LOW (ref 12.9–16.5)
LYMPHOCYTES ABSOLUTE COUNT: 2.9 10*9/L (ref 1.1–3.6)
LYMPHOCYTES RELATIVE PERCENT: 23.8 %
MEAN CORPUSCULAR HEMOGLOBIN CONC: 35.3 g/dL (ref 32.0–36.0)
MEAN CORPUSCULAR HEMOGLOBIN: 31.7 pg (ref 25.9–32.4)
MEAN CORPUSCULAR VOLUME: 89.8 fL (ref 77.6–95.7)
MEAN PLATELET VOLUME: 8.2 fL (ref 6.8–10.7)
MONOCYTES ABSOLUTE COUNT: 1.2 10*9/L — ABNORMAL HIGH (ref 0.3–0.8)
MONOCYTES RELATIVE PERCENT: 10.4 %
NEUTROPHILS ABSOLUTE COUNT: 7.7 10*9/L (ref 1.8–7.8)
NEUTROPHILS RELATIVE PERCENT: 64.2 %
NUCLEATED RED BLOOD CELLS: 1 /100{WBCs} (ref <=4)
PLATELET COUNT: 208 10*9/L (ref 150–450)
RED BLOOD CELL COUNT: 2.59 10*12/L — ABNORMAL LOW (ref 4.26–5.60)
RED CELL DISTRIBUTION WIDTH: 27.4 % — ABNORMAL HIGH (ref 12.2–15.2)
WBC ADJUSTED: 12 10*9/L — ABNORMAL HIGH (ref 3.6–11.2)

## 2022-05-01 LAB — SLIDE REVIEW

## 2022-05-01 LAB — RETICULOCYTES
RETICULOCYTE ABSOLUTE COUNT: 179.4 10*9/L — ABNORMAL HIGH (ref 23.0–100.0)
RETICULOCYTE COUNT PCT: 6.91 % — ABNORMAL HIGH (ref 0.50–2.17)

## 2022-05-01 NOTE — Unmapped (Signed)
Pt called for refill of :  Oxycodone 10mg , Ibuprofen 400mg , Hydroxyurea 500mg  to be sent to   [facility].  Pt states he will be in [place] this evening and tomorrow morning so he will stop at [place] and have safety labs done.  Orders placed for lab at [facility].  His next appt is in July with Bruna Potter, Sheh-li Chen & Dr.Goodman.

## 2022-05-01 NOTE — Unmapped (Deleted)
Pt called for refill of :  Oxycodone 10mg , Ibuprofen 400mg , Hydroxyurea 500mg  to be sent to   [facility].  Pt states he will be in [place] this evening and tomorrow morning so he will stop at [place] and have safety labs done.  Orders placed for lab at [facility].  His next appt is in July with Bruna Potter, Sheh-li Chen & Dr.Goodman.

## 2022-05-02 DIAGNOSIS — D571 Sickle-cell disease without crisis: Principal | ICD-10-CM

## 2022-05-02 MED ORDER — HYDROXYUREA 500 MG CAPSULE
ORAL_CAPSULE | Freq: Every day | ORAL | 1 refills | 30.00000 days | Status: CP
Start: 2022-05-02 — End: ?

## 2022-05-02 MED ORDER — IBUPROFEN 400 MG TABLET
ORAL_TABLET | Freq: Three times a day (TID) | ORAL | 1 refills | 30 days | Status: CP | PRN
Start: 2022-05-02 — End: ?

## 2022-05-02 NOTE — Unmapped (Signed)
Already filled

## 2022-05-02 NOTE — Unmapped (Signed)
Refills for Hydroxyurea (labs done 05/01/22 & reviewed) and Ibuprofen 400mg  sent to patients' preferred pharmacy.  [facility] [place]

## 2022-05-03 DIAGNOSIS — D5700 Hb-SS disease with crisis, unspecified: Principal | ICD-10-CM

## 2022-05-03 DIAGNOSIS — D571 Sickle-cell disease without crisis: Principal | ICD-10-CM

## 2022-05-03 MED ORDER — HYDROXYUREA 500 MG CAPSULE
ORAL_CAPSULE | Freq: Every day | ORAL | 1 refills | 30 days | Status: CP
Start: 2022-05-03 — End: ?

## 2022-05-03 MED ORDER — OXYCODONE 10 MG TABLET
ORAL_TABLET | Freq: Four times a day (QID) | ORAL | 0 refills | 30 days | PRN
Start: 2022-05-03 — End: 2022-06-02

## 2022-05-03 NOTE — Unmapped (Signed)
Pt called to request his prescription to be sent to a different pharmacy because original pharmacy does not have stock of medication. New pharmacy: [facility] [address] [place] - [address]

## 2022-05-04 MED ORDER — OXYCODONE 10 MG TABLET
ORAL_TABLET | ORAL | 0 refills | 10 days | Status: CP | PRN
Start: 2022-05-04 — End: ?

## 2022-05-04 NOTE — Unmapped (Signed)
Regarding: Clinic failed to send script into pharmacy, needs to have it called into a pharmacy that  ----- Message from Lajean Silvius sent at 05/04/2022  9:38 AM EDT -----  If you had not called [facility], what do you think you would have done?   Go to Emergency Dept

## 2022-05-04 NOTE — Unmapped (Signed)
Copied from CRM #[id number]. Topic: HL - Nurse Triage - [facility] Contract Page  >> May 04, 2022 12:13 PM Cathrine Muster wrote:  [phone] [facility] re: Warren Miles [phone]     Source should be ON CALL PROVIDER   Subject should indicate PATIENT   Attach the Triage Encounter by clicking Attachments and under Patient Actions Clinical Call -click copy and copy all notes and Continue   Enter the name & time of page into the Provider Paging form each time the on call provider is paged  Route/Resolve ONLY when there is a return page or the call is completed using appropriate reason and close workspace    [phone] [facility] re: Warren Miles [phone]   Dr. Weyman Pedro contacted, reviewed SBAR notes. He advised Tylenol/Motrin for his mild pain, go to ED for more acute pain, follow up with oncologist Monday for RX as the prescription states earliest fill date is 05/08/22. Patient in agreement.

## 2022-05-04 NOTE — Unmapped (Signed)
Recent:   What is the date of your last related visit?  None recently  Related acute medications Rx'd:  none  Home treatment tried:  ibuprofen, tylenol      Relevant:   Allergies: Patient has no known allergies.  Medications: oxycodone 10 mg(ran out yesterday 5/19)   Health History: sickle cell  Weight: 135 lbs    S-pain medication out of stock at patients pharmacy(patient lives 5.5 hrs away, in [facility]  B-sickle cell   A-mild pain now, last sickle crisis last week(not hospitalized)  R-call PCP now    [facility]  [address] [place], [place]  [phone]    Patient informs he lives in [place] and comes in [place] to see [facility] hematology clinic for sickle cell care, he drove 5.5 hrs to pick up his medication and [facility]    Reason for Disposition   [1] Prescription refill request for ESSENTIAL medicine (i.e., likelihood of harm to patient if not taken) AND [2] triager unable to refill per department policy    Answer Assessment - Initial Assessment Questions  1. DRUG NAME: What medicine do you need to have refilled?      Oxycodone 10 mg immediate release   2. REFILLS REMAINING: How many refills are remaining? (Note: The label on the medicine or pill bottle will show how many refills are remaining. If there are no refills remaining, then a renewal may be needed.)      zero  3. EXPIRATION DATE: What is the expiration date? (Note: The label states when the prescription will expire, and thus can no longer be refilled.)      10/30/2022  4. PRESCRIBING HCP: Who prescribed it? Reason: If prescribed by specialist, call should be referred to that group.      Sherriann Morris-AGNP, and patient sees Dr. Clarene Duke  5. SYMPTOMS: Do you have any symptoms?      Mild pain  6. PREGNANCY: Is there any chance that you are pregnant? When was your last menstrual period?      na    Protocols used: Medication Refill and Renewal Call-A-AH

## 2022-05-04 NOTE — Unmapped (Addendum)
Pt called in because pharmacy oxy 10mg  prescription was sent to didn't have it in stock and he was hoping to get it changed. I called the pharmacy and they confirmed there is a Sport and exercise psychologist of oxycodone 10mg  tablets. He comes to [place] to get his prescription and got labs this week and then was in town over the weekend and now planning to head back to [place]. I reviewed PDMP and last filled oxy 04/10/22. I will send oxy 10 mg, 60 pills to [facility] [address] in [place]. He is having mild pain right now but getting a little worse. Taking ibuprofen and tylenol because he is out of oxycodone. I called to discuss with patient and discussed with Dr. Harlow Ohms.    Rosine Door, Heme-Onc Fellow

## 2022-05-06 DIAGNOSIS — D571 Sickle-cell disease without crisis: Principal | ICD-10-CM

## 2022-05-06 NOTE — Unmapped (Signed)
[facility] Refill Coordination Note    Specialty Medication(s) to be Shipped:   Hematology/Oncology: Procrit ((DENIED))    Other medication(s) to be shipped: No additional medications requested for fill at this time     Warren Miles, DOB: Jun 10, 1986  Phone: [phone] (home)       All above HIPAA information was verified with patient.     Was a Nurse, learning disability used for this call? No    Completed refill call assessment today to schedule patient's medication shipment from the [facility] [phone]).  All relevant notes have been reviewed.     Specialty medication(s) and dose(s) confirmed: Regimen is correct and unchanged.   Changes to medications: Warren Miles reports no changes at this time.  Changes to insurance: No  New side effects reported not previously addressed with a pharmacist or physician: None reported  Questions for the pharmacist: No    Confirmed patient received a Conservation officer, historic buildings and a Surveyor, mining with first shipment. The patient will receive a drug information handout for each medication shipped and additional FDA Medication Guides as required.       DISEASE/MEDICATION-SPECIFIC INFORMATION        For patients on injectable medications: Patient currently has 4 doses left.  Next injection is scheduled for N/A.    SPECIALTY MEDICATION ADHERENCE     Medication Adherence    Specialty Medication: Procrit 10,000 unit/mL  Patient is on additional specialty medications: No              Were doses missed due to medication being on hold? Yes - Still hasn't started medication    Procrit 10,000 unit/mL: 30 days of medicine on hand       REFERRAL TO PHARMACIST     Referral to the pharmacist: Not needed      [facility]     Shipping address confirmed in Epic.     Delivery Scheduled: Patient declined refill at this time due to still hasn't started medication.     Medication will be delivered via Same Day Courier to the prescription address in Epic [place].    Wyatt Mage M Elisabeth Cara   [facility] Specialty Technician

## 2022-05-08 MED ORDER — OXYCODONE 10 MG TABLET
ORAL_TABLET | Freq: Four times a day (QID) | ORAL | 0 refills | 30 days | Status: CP | PRN
Start: 2022-05-08 — End: 2022-06-07

## 2022-05-14 NOTE — Unmapped (Signed)
Pt called to request refill for oxycodone.    Pharmacy

## 2022-05-15 NOTE — Unmapped (Signed)
Got it.  Thanks  [facility]

## 2022-05-15 NOTE — Unmapped (Signed)
Pt left a voicemail message with pharmacy name and location to use for his Oxycodone refill .  He would like his prescription sent to :  [id number] - [place], [place] - [address]

## 2022-05-16 NOTE — Unmapped (Signed)
Pt called to ask if you would refill his prescription for pain meds today (1 day early) since he needs to travel back to [place]?.  I told him I would ask but could not promise it could be done.

## 2022-05-17 MED ORDER — OXYCODONE 10 MG TABLET
ORAL_TABLET | ORAL | 0 refills | 10 days | Status: CP | PRN
Start: 2022-05-17 — End: ?

## 2022-05-18 MED ORDER — OXYCODONE 10 MG TABLET
ORAL_TABLET | ORAL | 0 refills | 10 days | Status: CP | PRN
Start: 2022-05-18 — End: ?

## 2022-06-03 DIAGNOSIS — D571 Sickle-cell disease without crisis: Principal | ICD-10-CM

## 2022-06-04 DIAGNOSIS — D571 Sickle-cell disease without crisis: Principal | ICD-10-CM

## 2022-06-04 MED ORDER — OXYCODONE 10 MG TABLET
ORAL_TABLET | ORAL | 0 refills | 10 days | Status: CP | PRN
Start: 2022-06-04 — End: ?

## 2022-06-04 MED ORDER — AMITRIPTYLINE 10 MG TABLET
ORAL_TABLET | Freq: Every evening | ORAL | 0 refills | 90 days | Status: CP
Start: 2022-06-04 — End: ?

## 2022-06-04 MED ORDER — IBUPROFEN 400 MG TABLET
ORAL_TABLET | Freq: Three times a day (TID) | ORAL | 1 refills | 30 days | Status: CP | PRN
Start: 2022-06-04 — End: ?

## 2022-06-04 MED ORDER — LIDOCAINE 5 % TOPICAL PATCH
MEDICATED_PATCH | Freq: Two times a day (BID) | TRANSDERMAL | 1 refills | 5 days | Status: CP
Start: 2022-06-04 — End: 2023-06-04

## 2022-06-04 NOTE — Unmapped (Signed)
Pt called today for refills for : Oxycodone 10mg  tabs: last script 05/17/22  Amitriptylline 10mg  tabs last script 02/28/22  Ibuprofen 400mg  last script 05/02/2022  Lidocaine patches  last script was 10/25/21    Please send to [facility] in [place].  Next appt 06/27/22 with Delice Bison, Dr Derrill Kay and Sheh-li .

## 2022-06-10 DIAGNOSIS — R809 Proteinuria, unspecified: Principal | ICD-10-CM

## 2022-06-10 DIAGNOSIS — D571 Sickle-cell disease without crisis: Principal | ICD-10-CM

## 2022-06-20 NOTE — Unmapped (Signed)
Pt called to request Oxycodone 10mg  to be sent to [facility].    Last script was given on 06/04/2022.        Next appointment scheduled 06/27/2022.

## 2022-06-21 NOTE — Unmapped (Signed)
Pt called to request refill for oxycodone.    [id number] - [place], [place] - [address]

## 2022-06-24 NOTE — Unmapped (Signed)
The [facility] has made a third and final attempt to reach this patient to refill the following medication:Procrit.      We have left voicemails on the following phone numbers: [phone] .    Dates contacted: 06/23, 06/26, 07/10  Last scheduled delivery: 02/21/22    The patient may be at risk of non-compliance with this medication. The patient should call the [facility] at [phone]  Option 4, then Option 1 (oncology) to refill medication.    Warren Miles Warren Miles   [facility] Specialty Pharmacist

## 2022-06-27 ENCOUNTER — Ambulatory Visit: Admit: 2022-06-27 | Discharge: 2022-06-27 | Payer: PRIVATE HEALTH INSURANCE

## 2022-06-27 ENCOUNTER — Ambulatory Visit
Admit: 2022-06-27 | Discharge: 2022-06-27 | Payer: PRIVATE HEALTH INSURANCE | Attending: Student in an Organized Health Care Education/Training Program | Primary: Student in an Organized Health Care Education/Training Program

## 2022-06-27 ENCOUNTER — Ambulatory Visit: Admit: 2022-06-27 | Discharge: 2022-06-27 | Payer: PRIVATE HEALTH INSURANCE | Attending: Oncology | Primary: Oncology

## 2022-06-27 DIAGNOSIS — D571 Sickle-cell disease without crisis: Principal | ICD-10-CM

## 2022-06-27 LAB — CBC W/ AUTO DIFF
BASOPHILS ABSOLUTE COUNT: 0.1 10*9/L (ref 0.0–0.1)
BASOPHILS RELATIVE PERCENT: 0.9 %
EOSINOPHILS ABSOLUTE COUNT: 0.1 10*9/L (ref 0.0–0.5)
EOSINOPHILS RELATIVE PERCENT: 1.1 %
HEMATOCRIT: 18.3 % — ABNORMAL LOW (ref 39.0–48.0)
HEMOGLOBIN: 6.7 g/dL — ABNORMAL LOW (ref 12.9–16.5)
LYMPHOCYTES ABSOLUTE COUNT: 3.3 10*9/L (ref 1.1–3.6)
LYMPHOCYTES RELATIVE PERCENT: 29.2 %
MEAN CORPUSCULAR HEMOGLOBIN CONC: 36.8 g/dL — ABNORMAL HIGH (ref 32.0–36.0)
MEAN CORPUSCULAR HEMOGLOBIN: 34.4 pg — ABNORMAL HIGH (ref 25.9–32.4)
MEAN CORPUSCULAR VOLUME: 93.6 fL (ref 77.6–95.7)
MEAN PLATELET VOLUME: 8 fL (ref 6.8–10.7)
MONOCYTES ABSOLUTE COUNT: 1.2 10*9/L — ABNORMAL HIGH (ref 0.3–0.8)
MONOCYTES RELATIVE PERCENT: 10.5 %
NEUTROPHILS ABSOLUTE COUNT: 6.6 10*9/L (ref 1.8–7.8)
NEUTROPHILS RELATIVE PERCENT: 58.3 %
PLATELET COUNT: 178 10*9/L (ref 150–450)
RED BLOOD CELL COUNT: 1.96 10*12/L — ABNORMAL LOW (ref 4.26–5.60)
RED CELL DISTRIBUTION WIDTH: 27.8 % — ABNORMAL HIGH (ref 12.2–15.2)
WBC ADJUSTED: 11.3 10*9/L — ABNORMAL HIGH (ref 3.6–11.2)

## 2022-06-27 LAB — IRON & TIBC
IRON SATURATION: 49 % (ref 20–55)
IRON: 117 ug/dL
TOTAL IRON BINDING CAPACITY: 238 ug/dL — ABNORMAL LOW (ref 250–425)

## 2022-06-27 LAB — SLIDE REVIEW

## 2022-06-27 LAB — C-REACTIVE PROTEIN: C-REACTIVE PROTEIN: <4 mg/L (ref <=10.0)

## 2022-06-27 LAB — FERRITIN: FERRITIN: 329.7 ng/mL — ABNORMAL HIGH

## 2022-06-27 LAB — VITAMIN B12: VITAMIN B-12: 366 pg/mL (ref 211–911)

## 2022-06-27 LAB — LACTATE DEHYDROGENASE: LACTATE DEHYDROGENASE: 837 U/L — ABNORMAL HIGH (ref 120–246)

## 2022-06-27 LAB — RETICULOCYTES
RETICULOCYTE ABSOLUTE COUNT: 319 10*9/L — ABNORMAL HIGH (ref 23.0–100.0)
RETICULOCYTE COUNT PCT: 16.28 % — ABNORMAL HIGH (ref 0.50–2.17)

## 2022-06-27 MED ORDER — OXYCODONE 10 MG TABLET
ORAL_TABLET | ORAL | 0 refills | 10 days | Status: CP | PRN
Start: 2022-06-27 — End: ?
  Filled 2022-06-27: qty 60, 10d supply, fill #0

## 2022-06-27 MED ORDER — HYDROXYZINE HCL 25 MG TABLET
ORAL_TABLET | 2 refills | 0 days | Status: CP
Start: 2022-06-27 — End: ?
  Filled 2022-06-27: qty 120, 30d supply, fill #0
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
## 2022-06-27 NOTE — Unmapped (Addendum)
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    Patient's presentation with intermittent anxiety, anger, irritability exacerbated by pain (but not persistent in times of decreased pain) and significant psychosocial stressors is most consistent with Adjustment disorder with anxious distress.  Given much of the patient's symptoms are circumstantial, would not recommend standing psychotropic at this time. To address situational anxiety and insomnia, recommend PRN Hydroxyzine. Overall, believe that patient will respond best to regular psychotherapy, and have discussed case with SCD therapist, Bryn Gulling. Patient does not need to be seen regularly by psychiatry at this time.    Risk Assessment:  A suicide and violence risk assessment was performed as part of this evaluation. There patient is deemed to be at chronic elevated risk for self-harm/suicide given the following factors: male age 32-35 and chronic severe medical condition. The patient is deemed to be at chronic elevated risk for violence given the following factors: male gender and younger age. These risk factors are mitigated by the following factors:lack of active SI/HI, no know access to weapons or firearms, no history of previous suicide attempts , no history of violence, motivation for treatment, utilization of positive coping skills, supportive family, sense of responsibility to family and social supports, minor children living at home, expresses purpose for living, current treatment compliance, effective problem solving skills, safe housing and support system in agreement with treatment recommendations. There is no acute risk for suicide or violence at this time. The patient was educated about relevant modifiable risk factors including following recommendations for treatment of psychiatric illness and abstaining from substance abuse.   While future psychiatric events cannot be accurately predicted, the patient does not currently require  acute inpatient psychiatric care and does not currently meet [facility] involuntary commitment criteria.      Diagnoses:   Patient Active Problem List   Diagnosis   ??? Sickle cell anemia (CMS-HCC)   ??? Tobacco use disorder   ??? Nocturnal hypoxia   ??? Migraine without aura and without status migrainosus, not intractable   ??? Dental caries       Stressors: living with chronic disease, chronic pain/insomnia/anxiety     Plan:   ## Generalized Anxiety Disorder  -- START Hydroxyzine PRN --> take 25mg  up to twice a day as needed for anxiety; take 1-2 tablets nightly as needed for insomnia  -- Referring to Bryn Gulling LCWS for psychotherapy    This visit was performed in consultation with the [facility] Sickle Cell Program ([facility]).  Recommendations were discussed with and will be managed by [facility] team providers.    Return to clinic: PRN.  No current need for follow-up.  Russ Halo, MD      Subjective:    Psychiatric Chief Concern:  Initial evaluation    HPI: Patient is a 36 y.o., Black/African American race, Not Hispanic, Latino/a, or Spanish origin ethnicity,  ENGLISH speaking male with a history of sickle cell disease (Hgb SS) complicated by chronic pain.   Reviewed notes in Epic and  CareEverywhere.  Discussed case with [place] providers.    Spoke to patient in person. He reports constantly worrying about everything - my family, finances, my health. He reports intermittent palpitations, irritability, and anger outbursts, but generally isolates and takes deep breaths to figure it out. He reports his pain level is a 3-4/10 on a good day and 6/10 on a bad; he feels that current pain medications have been helpful. He reports that when I have a bad pain day, I am on the couch with a heating pad, reading a book or watching TV - anything that takes my mind off of it. He feels that his time with his children (particularly his 7yo son) suffers when he is in pain. He endorses some difficulty sleeping 2/2 his mind wandering and is open to a medication to address this.    Psychiatric History:  Prior psychiatric diagnoses: none  Psychiatric hospitalizations: denies  Substance abuse treatment: none  Suicide attempts: denies  Non-suicidal self-injury: denies  Medication trials/compliance: none  Current psychiatrist:none    Allergies:  Patient has no known allergies.    Medications:   Current Outpatient Medications   Medication Sig Dispense Refill   ??? acetaminophen (TYLENOL) 500 MG tablet Take 2 tablets (1,000 mg total) by mouth every eight (8) hours. 30 tablet 1   ??? amitriptyline (ELAVIL) 10 MG tablet Take 2 tablets (20 mg total) by mouth nightly. 180 tablet 0   ??? cholecalciferol, vitamin D3 25 mcg, 1,000 units,, 1,000 unit (25 mcg) tablet Take 1 tablet (25 mcg total) by mouth daily. 90 tablet 3   ??? cholecalciferol, vitamin D3-25 mcg, 1,000 unit,, 25 mcg (1,000 unit) capsule Take 1 capsule (25 mcg total) by mouth daily.     ??? epoetin alfa (PROCRIT) 10,000 unit/mL injection Inject 1 mL (10,000 Units total) under the skin once a week. (Patient not taking: Reported on 03/14/2022) 4 mL 2   ??? folic acid (FOLVITE) 1 MG tablet TAKE 1 TABLET BY MOUTH EVERY DAY 90 tablet 1   ??? HYDROmorphone (DILAUDID) 4 MG tablet Take 1 tablet (4 mg total) by mouth every four (4) hours as needed for moderate pain. (Patient not taking: Reported on 03/14/2022) 30 tablet 0   ??? hydroxyurea (HYDREA) 500 mg capsule TAKE 2 CAPSULES (1,000 MG TOTAL) BY MOUTH IN THE MORNING. 60 capsule 1   ??? hydroxyurea (HYDREA) 500 mg capsule Take 2 capsules (1,000 mg total) by mouth daily. 60 capsule 1   ??? ibuprofen (MOTRIN) 400 MG tablet Take 1 tablet (400 mg total) by mouth every eight (8) hours as needed for pain. 90 tablet 1   ??? lidocaine (LIDODERM) 5 % patch Place 1 patch on the skin to affected area for 12 hours only each day (then remove patch) 10 patch 1   ??? metoprolol succinate (TOPROL-XL) 25 MG 24 hr tablet Take 1 tablet (25 mg total) by mouth nightly. 30 tablet 11   ??? oxyCODONE (ROXICODONE) 10 mg immediate release tablet Take 1 tablet (10 mg total) by mouth every four (4) hours as needed for pain. 60 tablet 0   ??? syringe (BD TUBERCULIN SYRINGE) 1 mL 27 x 1/2 Syrg 1 each by Miscellaneous route once a week. For procrit injections 25 each 2     Current Facility-Administered Medications   Medication Dose Route Frequency Provider Last Rate Last Admin   ??? epoetin alfa (EPOGEN,PROCRIT) injection 10,000 Units  10,000 Units Intravenous Once per day on Mon Wed Fri Abran Cantor Alin, ANP       ???  epoetin alfa (EPOGEN,PROCRIT) injection 10,000 Units  10,000 Units Subcutaneous Once Carmel Sacramento, ANP           Medical History:  Past Medical History:   Diagnosis Date   ??? Albuminuria    ??? Chronic headaches    ??? Chronic kidney disease    ??? Nocturnal hypoxia    ??? Sickle cell anemia (CMS-HCC)    ??? SVT (supraventricular tachycardia) (CMS-HCC)        Surgical History:  Past Surgical History:   Procedure Laterality Date   ??? CHOLECYSTECTOMY     ??? HERNIA REPAIR         Social History:  Social History     Socioeconomic History   ??? Marital status: Media planner     Spouse name: Amada Jupiter   ??? Number of children: 3   Tobacco Use   ??? Smoking status: Some Days     Packs/day: 0.50     Types: Cigarettes   ??? Smokeless tobacco: Never   Substance and Sexual Activity   ??? Alcohol use: Not Currently   ??? Drug use: Not Currently   Social History Narrative    Lives in [place] with wife, and three children. Not employed.     Living situation: the patient lives with finance, her mother, and his children  Address ([place], [place], [address]): [place], [place]  Guardian/Payee: None      Relationship Status: In committed relationship   Children: 7yo son, two teenage daughters  Education: High school diploma/GED  Income/Employment/Disability: Disabled 2/2 SCD; not on SSDI    Tobacco use: cigarettes 1/2-1 PPD  Alcohol use: none  Other Drugs: intermittent cannabis use    Family History:  The patient's family history includes Diabetes in his mother; No Known Problems in his half-brother, half-brother, half-brother, half-brother, half-brother, half-brother, half-sister, half-sister, half-sister, half-sister, half-sister, and half-sister; Sickle cell trait in his daughter, daughter, father, mother, and son..    ROS:   The balance of 10 systems reviewed is negative except as per HPI.     Objective:     Vitals:   Vitals:    06/27/22 1129   BP: 122/68   Pulse: 90   Temp: 36.7 ??C (98.1 ??F)   SpO2: 93%       Mental Status Exam:  Appearance:    Appears stated age, Well nourished, Well developed and Clean/Neat   Motor:   No abnormal movements   Speech/Language:    Normal rate, volume, tone, fluency   Mood:   alright   Affect:   Euthymic and Full   Thought process:   Logical, linear, clear, coherent, goal directed   Thought content:     Denies SI, HI, self harm, delusions, obsessions, paranoid ideation, or ideas of reference   Perceptual disturbances:     Denies auditory and visual hallucinations, behavior not concerning for response to internal stimuli     Orientation:   Oriented to person, place, time, and general circumstances   Attention:   Able to fully attend without fluctuations in consciousness   Concentration:   Able to fully concentrate and attend   Memory:   Immediate, short-term, long-term, and recall grossly intact    Fund of knowledge:    Consistent with level of education and development   Insight:     Intact   Judgment:    Intact   Impulse Control:   Intact     PE:   Gen - in NAD  Pulm - normal work of  breathing  Neuro - moving all extremities spontaneously, EOM grossly intact to conversation without nystagmus, no tremor, normal gait    Test Results:  Data Review: Lab results last 24 hours:    Recent Results (from the past 24 hour(s))   ECG 12 lead    Collection Time: 06/27/22  2:53 PM   Result Value Ref Range    EKG Systolic BP  mmHg    EKG Diastolic BP  mmHg    EKG Ventricular Rate 88 BPM    EKG Atrial Rate 88 BPM    EKG P-R Interval 150 ms    EKG QRS Duration 92 ms    EKG Q-T Interval 390 ms    EKG QTC Calculation 471 ms    EKG Calculated P Axis 70 degrees    EKG Calculated R Axis 68 degrees    EKG Calculated T Axis 50 degrees    QTC Fredericia 443 ms     Lab Results   Component Value Date    VITAMINB12 322 03/19/2021    VITAMINB12 358 03/03/2020       Lab Results   Component Value Date    FOLATE 21.6 03/19/2021       Lab Results   Component Value Date    TSH 3.083 03/19/2021    TSH 0.568 03/03/2020    TSH 1.337 01/27/2019       Lab Results   Component Value Date    VITDTOTAL 35.7 03/19/2021    VITDTOTAL 5.4 (L) 03/03/2020       Imaging: Radiology report(s) reviewed.    Psychometrics:

## 2022-06-27 NOTE — Unmapped (Addendum)
It was a pleasure to see you today in clinic!    Plan from today's visit  Follow up in 2 weeks for a video visit to review treatment recommendations  We discussed starting low dose methadone for your chronic pain we also briefly reviewed suboxone. Please look this up and we'll discuss further at follow up  I have ordered xrays of your back and hips.         How to get in touch with your Sickle Cell Team:     If you are sick, and need an ambulance, CALL 911.    Otherwise, [phone], 8AM - 5PM, and ask for the NURSE TRIAGE LINE and leave a message   On the weekends, call [phone] and ask for the HEMATOLOGIST ON CALL for urgent problems only.     2.  For an appointment, please call [phone], 8AM - 5PM.    3.  For pain medication refills, leave a message on nurse triage line THREE days before refill needed. All other medication refills ask your pharmacy to fax request.       4.  For general questions, call [phone]

## 2022-06-27 NOTE — Unmapped (Signed)
Clinical Pharmacist Practitioner: Sickle Clinic     Warren Miles is a 36 y.o. male with hgb SS disease who I am seeing today for home pain management counseling.    Current pain regimen:   oxycodone 10 mg q4hrs PRN (60 tablets of oxycodone for 10 days, 90 MME)  acetaminophen 1000 mg q8 hrs as needed   ibuprofen 400 mg q8 hrs as needed for pain     Current hydroxyurea management: 1000 mg once daily    Plan:  Add capsaicin and diclofenac creams to home pain management regimen    Counseled patient on:  -importance of adherence to hydroxyurea and how adherence to Hydrea can lead to less incidences of pain crises and hospitalizations.   -importance of folic acid adherence.   -use of naloxone and how to administer naloxone.   -adverse effects of oxycodone such as over sedation and decreased respiratory drive.   -Pain management plan.   -Stress management strategies   -Emphasis non pharmacologic methods to deal with pain like hot packs on areas of increased pain, adequate hydration, and stress management.   -addition of non-opioid medications to manage more specific moderate pain on back and hips like capsaicin and diclofenac creams.     Provided patient   -pain action plan sheet as below  -CDC living well with sickle cell disease:   --coping with stress, stress diary    Mr.Kirshenbaum verbalized understanding of the education provided and had no further questions.     ___________________________________________________________________    Interim Pain History:  Patient reported taking ~1 oxycodone daily  Current pain regimen: oxycodone 10 mg q4 hrs PRN as needed for pain, acetaminophen 1000 mg q8 hrs as needed for, ibuprofen 400 mg q8 hrs as needed for pain   Requirement of break through medications: when pain goes from mild to moderate 5 to 6)  Last dose: 06/27/22    Reported Pain Scores:  Worst:  9/10  Least:  3/10  Right Now:  6/10    Reported Description of Pain:  Location:  back and hips  Character:  Sharp and sore Frequency:  All the time worsened by triggers   Pain is worst in:  mornings  Triggers: weather changes, cold weather, hot weather, stress, dehydration, increased activity     Reported Effectiveness of Pain Medications since last visit:    Patient documents that their pain stayed the same since their last visit.  They documented that they are stable on their current regimen and that the medications do help to improve the quality of their life.    Constipation: mild constipation relieved with Miralax or docusate  Sedation: mild sedation  Other adverse effects: None    There have been 0 hospitalizations, 0 emergency room visits, since last clinic visit on 03/14/22    NCCSRS database was reviewed today and it was appropriate.  Recent opioid refills as per the NCCSRS:   - oxycodone 10 mg tablets #60 filled on 06/05/2022  -tablet count today: 0    There are no aberrant drug related behaviors observed.  Discussed medication adherence and safety with patient, who is in agreement with treatment plan as outlined above.     Interim hydroxyurea history:  Current Hydroxyurea regimen: 1000 mg once daily  (patient takes in the morning)  Patient reported missing 0 doses in the last week    Side effects: None reported  MCV today 93.6, was 89.8 on 05/01/22        Entered by Laurita Quint, PharmD  Candidate, acting as scribe for [employer], PharmD, CPP. Signature: Laurita Quint.  June 28, 2022 1:37 PM    The documentation recorded by the scribe accurately reflects the service I personally performed and the decisions made by me. I was present during patient interview Signature: Audie Box, PharmD, CPP. June 28, 2022 1:37 PM.      Approximate face time spent with patient: 60 minutes     Audie Box, PharmD, BCOP, CPP  Clinical Pharmacist Practitioner, Benign Hematology       Pain Action Plan                                                               Sickle Cell Team Hemoglobin Type   Nigel Berthold, MD, Memory Dance, MD, Bruna Potter, ANP, Jerilynn Som, AGPCNP-BC, and Venia Minks, MD Hemoglobin SS       Emergency numbers:  [phone] (24 hours)     Call 911 or come to the emergency department immediately if:  Chest pain and difficulty breathing  Stroke symptoms  Vision changes      Common triggers for pain crisis and how to prevent:  Cold weather: Dress warmly and in layers  Swim/water activities: Limit time in the water, change out of wet clothes  Dehydration: Drink more liquids  Exercise: Warm up, drink plenty of liquids, take breaks  Hot weather: Drink plenty of fluids, limit time outside  Infection: Wash hands, avoid sick contacts  Stress: Work with family/friends/medical team to improve coping skill and learn techniques to lower stress levels  Emotions: Consider ways to deal with emotions: journaling, art therapy, talking with friends/family  Trauma: Be careful with chosen activities          GREEN ZONE - GO   Symptoms   No pain or pain is at normal level (at baseline)     Advice   Continue all regularly scheduled medications  Continue ways to prevent triggers for pain crisis (refer above)  Check your medication supply on a regular basis and call your hematology team if you are running out of  medications.    Regularly Scheduled Medications  Folic acid: Helps make new red blood cells  Hydroxyurea: Helps prevent pain and other problems related to sickle cell disease Medications for chronic or nerve pain    Amitriptyline            YELLOW ZONE - CAUTION   Symptoms:  Increased pain, more than baseline pain  Onset of new pain episode     Advice:  Start your ???as needed??? medications  (both can be taken at the same time)  Continue your regular ???Green Zone??? medications   Increase hydration, rest, stay warm, use warm/hot packs  Can also try over the counter topical ointments  Discuss with your hematology team some distraction techniques that you can try at home to help manage your pain   Call your hematology team if you are not improving or you are running out of medications!    Anti-inflammatory medication  Decreases pain and swelling    Ibuprofen (Examples: Advil??; Motrin??)  Acetaminophen    Topical anti-inflammatory or analgesic medication  Placed on the skin to provide pain relief    Diclofenac (Voltaren)   Capsacin  Analgesic medication  Decreases pain  Oxycodone (Roxicodone)  Capsaicin Cream                 RED ZONE - DANGER   Symptoms:  Pain not controlled at home with ???as needed??? medications after 24 hours OR pain worsening or becomes severe  Fever  Difficulty breathing or pain in the chest  Extreme fatigue or pallor  Increased spleen size  Signs of stroke - difficulty walking/talking  Signs of a blood clot in the leg - pain, swelling, changes in color     Advice:  CALL your hematology team immediately and/or GO to your nearest hospital.

## 2022-06-29 LAB — ERYTHROPOIETIN: ERYTHROPOIETIN: 91.9 m[IU]/mL — ABNORMAL HIGH

## 2022-07-01 DIAGNOSIS — D571 Sickle-cell disease without crisis: Principal | ICD-10-CM

## 2022-07-02 LAB — VITAMIN D 25 HYDROXY: VITAMIN D, TOTAL (25OH): 30.2 ng/mL (ref 20.0–80.0)

## 2022-07-02 NOTE — Unmapped (Signed)
CHRONIC PAIN CLINIC    Met with patient during clinic visit. Reports that he and family live in [place]. Sometimes stays with family in [place] when he has appointments. Pain is frequent but he tries to avoid the ED. Not able to describe his pain but in number form - he will think about a more specific description and share this with [place] later. Admits some stress, which he notices increases pain. Everyone in the family comes to me.     Pt is interested in psychotherapy after discussion with Dr. Derrill Kay, will follow up to schedule.     GAD, PHQ, and SOAP-R not returned.

## 2022-07-10 MED ORDER — OXYCODONE 10 MG TABLET
ORAL_TABLET | ORAL | 0 refills | 10 days | Status: CP | PRN
Start: 2022-07-10 — End: ?

## 2022-07-10 NOTE — Unmapped (Signed)
Pt called to request refill for oxycodone and antibiotics. Has a cracked tooth and jaw swollen. He is trying to find a Education officer, community.

## 2022-07-15 ENCOUNTER — Ambulatory Visit: Admit: 2022-07-15 | Payer: PRIVATE HEALTH INSURANCE

## 2022-07-17 DIAGNOSIS — D5700 Hb-SS disease with crisis, unspecified: Principal | ICD-10-CM

## 2022-07-22 MED ORDER — OXYCODONE 10 MG TABLET
ORAL_TABLET | ORAL | 0 refills | 10 days | Status: CP | PRN
Start: 2022-07-22 — End: ?

## 2022-07-29 DIAGNOSIS — D571 Sickle-cell disease without crisis: Principal | ICD-10-CM

## 2022-08-01 MED ORDER — IBUPROFEN 400 MG TABLET
ORAL_TABLET | Freq: Three times a day (TID) | ORAL | 0 refills | 30 days | Status: CP | PRN
Start: 2022-08-01 — End: 2022-08-31

## 2022-08-01 MED ORDER — OXYCODONE 10 MG TABLET
ORAL_TABLET | ORAL | 0 refills | 3 days | Status: CP | PRN
Start: 2022-08-01 — End: ?

## 2022-08-01 NOTE — Unmapped (Signed)
Pt called for refill of Oxycodone 10mg , he has sent messages directly to Bruna Potter, about a broken tooth and not able to see dentist un 8/28.  His last script was 07/23/22 for #56 tabs. He would like it sent to [facility] in [place].  Pt states he is travelling back to [facility] and would like the refill before then.  He also asked for ibuprofen 400mg  refill which I can send to pharmacy.

## 2022-08-01 NOTE — Unmapped (Signed)
Ibuprofen 400mg  refill sent to [facility] .

## 2022-08-01 NOTE — Unmapped (Signed)
Notified patient  Warren Miles

## 2022-08-08 NOTE — Unmapped (Addendum)
Patient called to request a refill on his oxycodone 10 last filled 08/01/22 and hydroxyurea 500 #60 with 1rf, last filled 05/03/22 be sent to the [facility] in [place], on [address] pharmacy. Patient had safety labs done last on 06/27/2022.   Ty  lp

## 2022-08-09 MED ORDER — OXYCODONE 10 MG TABLET
ORAL_TABLET | ORAL | 0 refills | 9 days | Status: CP | PRN
Start: 2022-08-09 — End: ?

## 2022-08-09 NOTE — Unmapped (Signed)
Error

## 2022-08-16 NOTE — Unmapped (Signed)
[facility] Chronic Pain Summary    Situation: This is a 36 year old with HbSS disease who presents to our comprehensive pain clinic for evaluation and treatment recommendations. Patient has been evaluated by one or more of the following disciplines medicine, psychiatry, pharmacy, and social work to help improve pain and quality of life.     Background: Patient reports 7 pain days per week. Current home management regimen includes:     ED utilization:*  2020-7  2021-2  2022-0  2023-1  Hospitalizations:   2020-0  2021-0  2022-0  2023-1  *[place]. Unable to see all ED visits and admission in care Everywere    Assessment:  This is a 36 year old with HbSS disease who has chronic pain and increasing opioid tolerance. In Jan. 2023 we attempted opioid rotation to hydromorphone but patient reported the hydromorphone made him feel nauseous and he didn't think it was as effective. Suspect component of OIH versus central sensitization. Patient would likely benefit suboxone or perhaps methadone.       Recommendations:   -Optimize non-opioid therapies.   -Consider crizanlizumab  -Continue to manage oxycodone rx every two weeks with slow taper  -Review suboxone and methadone again at next visit.
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
## 2022-08-21 LAB — CBC W/ DIFFERENTIAL
BANDED NEUTROPHILS ABSOLUTE COUNT: 0 10*3/uL (ref 0.0–0.1)
BASOPHILS ABSOLUTE COUNT: 0.1 10*3/uL (ref 0.0–0.2)
BASOPHILS RELATIVE PERCENT: 1 %
EOSINOPHILS ABSOLUTE COUNT: 0.2 10*3/uL (ref 0.0–0.4)
EOSINOPHILS RELATIVE PERCENT: 2 %
HEMATOCRIT: 22.1 % — ABNORMAL LOW (ref 37.5–51.0)
HEMOGLOBIN: 7.8 g/dL — ABNORMAL LOW (ref 13.0–17.7)
IMMATURE GRANULOCYTES: 0 %
LYMPHOCYTES ABSOLUTE COUNT: 2.5 10*3/uL (ref 0.7–3.1)
LYMPHOCYTES RELATIVE PERCENT: 28 %
MEAN CORPUSCULAR HEMOGLOBIN CONC: 35.3 g/dL (ref 31.5–35.7)
MEAN CORPUSCULAR HEMOGLOBIN: 35.5 pg — ABNORMAL HIGH (ref 26.6–33.0)
MEAN CORPUSCULAR VOLUME: 101 fL — ABNORMAL HIGH (ref 79–97)
MONOCYTES ABSOLUTE COUNT: 0.7 10*3/uL (ref 0.1–0.9)
MONOCYTES RELATIVE PERCENT: 7 %
NEUTROPHILS ABSOLUTE COUNT: 5.4 10*3/uL (ref 1.4–7.0)
NEUTROPHILS RELATIVE PERCENT: 62 %
NUCLEATED RED BLOOD CELLS: 6 % — ABNORMAL HIGH (ref 0–0)
PLATELET COUNT: 209 10*3/uL (ref 150–450)
RED BLOOD CELL COUNT: 2.2 x10E6/uL — CL (ref 4.14–5.80)
RED CELL DISTRIBUTION WIDTH: 25.2 % — ABNORMAL HIGH (ref 11.6–15.4)
WHITE BLOOD CELL COUNT: 8.9 10*3/uL (ref 3.4–10.8)

## 2022-08-21 LAB — RETICULOCYTES: RETICULOCYTE COUNT PCT: 12.3 % — ABNORMAL HIGH (ref 0.6–2.6)

## 2022-08-22 DIAGNOSIS — D571 Sickle-cell disease without crisis: Principal | ICD-10-CM

## 2022-08-22 MED ORDER — HYDROXYUREA 500 MG CAPSULE
ORAL_CAPSULE | Freq: Every day | ORAL | 1 refills | 30 days | Status: CP
Start: 2022-08-22 — End: ?

## 2022-08-22 MED ORDER — OXYCODONE 10 MG TABLET
ORAL_TABLET | ORAL | 0 refills | 9 days | Status: CP | PRN
Start: 2022-08-22 — End: ?

## 2022-08-23 MED ORDER — OXYCODONE 10 MG TABLET
ORAL_TABLET | ORAL | 0 refills | 9 days | PRN
Start: 2022-08-23 — End: ?

## 2022-08-26 DIAGNOSIS — D571 Sickle-cell disease without crisis: Principal | ICD-10-CM

## 2022-09-04 NOTE — Unmapped (Signed)
Pt called for refill of Oxycodone 10mg  to be sent to [facility] in [place].  His last script was 08/22/22 #54 tabs.  He did make an appt for 09/16/22 @ 3:30pm with you.  He also asked about a sickle cell provider in [place], [place]. And would like to speak to you about this at his appointment. He has officially moved there and coming here for care is very difficult.

## 2022-09-05 MED ORDER — OXYCODONE 10 MG TABLET
ORAL_TABLET | ORAL | 0 refills | 9 days | Status: CP | PRN
Start: 2022-09-05 — End: ?

## 2022-09-05 NOTE — Unmapped (Signed)
Thanks pt notified.  Warren Miles

## 2022-09-11 DIAGNOSIS — D571 Sickle-cell disease without crisis: Principal | ICD-10-CM

## 2022-09-11 MED ORDER — AMITRIPTYLINE 10 MG TABLET
ORAL_TABLET | 0 refills | 0 days | Status: CP
Start: 2022-09-11 — End: ?

## 2022-09-18 MED ORDER — OXYCODONE 10 MG TABLET
ORAL_TABLET | ORAL | 0 refills | 9 days | Status: CP | PRN
Start: 2022-09-18 — End: ?

## 2022-09-23 DIAGNOSIS — D571 Sickle-cell disease without crisis: Principal | ICD-10-CM

## 2022-09-25 DIAGNOSIS — D5700 Hb-SS disease with crisis, unspecified: Principal | ICD-10-CM

## 2022-10-04 ENCOUNTER — Ambulatory Visit: Admit: 2022-10-04 | Discharge: 2022-10-05

## 2022-10-04 DIAGNOSIS — D571 Sickle-cell disease without crisis: Principal | ICD-10-CM

## 2022-10-04 LAB — MANUAL DIFFERENTIAL
BASOPHILS - ABS (DIFF): 0 10*9/L (ref 0.0–0.1)
BASOPHILS - REL (DIFF): 0 %
EOSINOPHILS - ABS (DIFF): 0.1 10*9/L (ref 0.0–0.5)
EOSINOPHILS - REL (DIFF): 1 %
LYMPHOCYTES - ABS (DIFF): 1.5 10*9/L (ref 1.1–3.6)
LYMPHOCYTES - REL (DIFF): 17 %
MONOCYTES - ABS (DIFF): 0.2 10*9/L — ABNORMAL LOW (ref 0.3–0.8)
MONOCYTES - REL (DIFF): 2 %
NEUTROPHILS - ABS (DIFF): 7.2 10*9/L (ref 1.8–7.8)
NEUTROPHILS - REL (DIFF): 80 %

## 2022-10-04 LAB — URINALYSIS WITH MICROSCOPY WITH CULTURE REFLEX
BILIRUBIN UA: NEGATIVE
GLUCOSE UA: NEGATIVE
HYALINE CASTS: 2 /LPF — ABNORMAL HIGH (ref <=0)
KETONES UA: NEGATIVE
LEUKOCYTE ESTERASE UA: NEGATIVE
NITRITE UA: NEGATIVE
PH UA: 6 (ref 5.0–9.0)
RBC UA: <1 /HPF (ref 0–3)
SPECIFIC GRAVITY UA: 1.015 (ref 1.005–1.030)
SQUAMOUS EPITHELIAL: <1 /HPF (ref 0–5)
UROBILINOGEN UA: 0.2
WBC UA: <1 /HPF (ref 0–3)

## 2022-10-04 LAB — IRON & TIBC
IRON SATURATION: 35 % (ref 20–55)
IRON: 82 ug/dL
TOTAL IRON BINDING CAPACITY: 233 ug/dL — ABNORMAL LOW (ref 250–425)

## 2022-10-04 LAB — BASIC METABOLIC PANEL
ANION GAP: 8 mmol/L (ref 5–14)
BLOOD UREA NITROGEN: 9 mg/dL (ref 9–23)
BUN / CREAT RATIO: 11
CALCIUM: 9.2 mg/dL (ref 8.7–10.4)
CHLORIDE: 112 mmol/L — ABNORMAL HIGH (ref 98–107)
CO2: 22.4 mmol/L (ref 20.0–31.0)
CREATININE: 0.84 mg/dL
EGFR CKD-EPI (2021) MALE: >90 mL/min/{1.73_m2} (ref >=60)
GLUCOSE RANDOM: 116 mg/dL (ref 70–179)
POTASSIUM: 4 mmol/L (ref 3.4–4.8)
SODIUM: 142 mmol/L (ref 135–145)

## 2022-10-04 LAB — CBC W/ AUTO DIFF
HEMATOCRIT: 21 % — ABNORMAL LOW (ref 39.0–48.0)
HEMOGLOBIN: 7.7 g/dL — ABNORMAL LOW (ref 12.9–16.5)
MEAN CORPUSCULAR HEMOGLOBIN CONC: 36.8 g/dL — ABNORMAL HIGH (ref 32.0–36.0)
MEAN CORPUSCULAR HEMOGLOBIN: 37.2 pg — ABNORMAL HIGH (ref 25.9–32.4)
MEAN CORPUSCULAR VOLUME: 101.1 fL — ABNORMAL HIGH (ref 77.6–95.7)
MEAN PLATELET VOLUME: 7.7 fL (ref 6.8–10.7)
NUCLEATED RED BLOOD CELLS: 12 /100{WBCs}
PLATELET COUNT: 245 10*9/L (ref 150–450)
RED BLOOD CELL COUNT: 2.07 10*12/L — ABNORMAL LOW (ref 4.26–5.60)
RED CELL DISTRIBUTION WIDTH: 34 % — ABNORMAL HIGH (ref 12.2–15.2)
WBC ADJUSTED: 9 10*9/L (ref 3.6–11.2)

## 2022-10-04 LAB — TOXICOLOGY SCREEN, URINE
AMPHETAMINE SCREEN URINE: NEGATIVE
BARBITURATE SCREEN URINE: NEGATIVE
BENZODIAZEPINE SCREEN, URINE: NEGATIVE
BUPRENORPHINE, URINE SCREEN: NEGATIVE
CANNABINOID SCREEN URINE: POSITIVE — AB
COCAINE(METAB.)SCREEN, URINE: NEGATIVE
FENTANYL SCREEN, URINE: NEGATIVE
METHADONE SCREEN, URINE: NEGATIVE
OPIATE SCREEN URINE: NEGATIVE
OXYCODONE SCREEN URINE: POSITIVE — AB

## 2022-10-04 LAB — HEPATIC FUNCTION PANEL
ALBUMIN: 4.3 g/dL (ref 3.4–5.0)
ALKALINE PHOSPHATASE: 63 U/L (ref 46–116)
ALT (SGPT): 8 U/L — ABNORMAL LOW (ref 10–49)
AST (SGOT): 24 U/L (ref <=34)
BILIRUBIN DIRECT: 0.6 mg/dL — ABNORMAL HIGH (ref 0.00–0.30)
BILIRUBIN TOTAL: 1.5 mg/dL — ABNORMAL HIGH (ref 0.3–1.2)
PROTEIN TOTAL: 7.3 g/dL (ref 5.7–8.2)

## 2022-10-04 LAB — ALBUMIN / CREATININE URINE RATIO
ALBUMIN QUANT URINE: 9.2 mg/dL
ALBUMIN/CREATININE RATIO: 150.3 ug/mg — ABNORMAL HIGH (ref 0.0–30.0)
CREATININE, URINE: 61.2 mg/dL

## 2022-10-04 LAB — C-REACTIVE PROTEIN: C-REACTIVE PROTEIN: <4 mg/L (ref <=10.0)

## 2022-10-04 LAB — B-TYPE NATRIURETIC PEPTIDE: B-TYPE NATRIURETIC PEPTIDE: 163.54 pg/mL — ABNORMAL HIGH (ref <=100)

## 2022-10-04 LAB — RETICULOCYTES
RETICULOCYTE ABSOLUTE COUNT: 186.2 10*9/L — ABNORMAL HIGH (ref 23.0–100.0)
RETICULOCYTE COUNT PCT: 8.98 % — ABNORMAL HIGH (ref 0.50–2.17)

## 2022-10-04 LAB — LACTATE DEHYDROGENASE: LACTATE DEHYDROGENASE: 508 U/L — ABNORMAL HIGH (ref 120–246)

## 2022-10-04 LAB — FERRITIN: FERRITIN: 297.7 ng/mL

## 2022-10-04 MED ORDER — IBUPROFEN 400 MG TABLET
ORAL_TABLET | Freq: Four times a day (QID) | ORAL | 0 refills | 8 days | Status: CP | PRN
Start: 2022-10-04 — End: ?
  Filled 2022-10-04: qty 30, 7d supply, fill #0

## 2022-10-04 MED ORDER — AMITRIPTYLINE 10 MG TABLET
ORAL_TABLET | Freq: Every evening | ORAL | 0 refills | 90 days | Status: CP
Start: 2022-10-04 — End: ?
  Filled 2022-10-04: qty 21, 10d supply, fill #0

## 2022-10-04 MED ORDER — METOPROLOL SUCCINATE ER 25 MG TABLET,EXTENDED RELEASE 24 HR
ORAL_TABLET | Freq: Every evening | ORAL | 0 refills | 30 days | Status: CP
Start: 2022-10-04 — End: 2023-10-04
  Filled 2022-10-04: qty 30, 30d supply, fill #0

## 2022-10-04 MED ORDER — CHOLECALCIFEROL (VITAMIN D3) 25 MCG (1,000 UNIT) TABLET
ORAL_TABLET | Freq: Every day | ORAL | 3 refills | 90 days | Status: CP
Start: 2022-10-04 — End: 2023-10-04
  Filled 2022-10-04: qty 30, 30d supply, fill #0

## 2022-10-04 MED ORDER — OXYCODONE 10 MG TABLET
ORAL_TABLET | ORAL | 0 refills | 9 days | Status: CP | PRN
Start: 2022-10-04 — End: ?
  Filled 2022-10-04: qty 52, 9d supply, fill #0

## 2022-10-04 MED ORDER — LIDOCAINE 5 % TOPICAL PATCH
MEDICATED_PATCH | Freq: Two times a day (BID) | TRANSDERMAL | 1 refills | 5 days | Status: CP
Start: 2022-10-04 — End: 2023-10-04

## 2022-10-04 MED ORDER — FOLIC ACID 1 MG TABLET
ORAL_TABLET | Freq: Every day | ORAL | 1 refills | 90 days | Status: CP
Start: 2022-10-04 — End: ?
  Filled 2022-10-04: qty 30, 30d supply, fill #0

## 2022-10-04 MED ORDER — HYDROXYZINE HCL 25 MG TABLET
ORAL_TABLET | 2 refills | 0 days | Status: CP
Start: 2022-10-04 — End: ?
  Filled 2022-10-04: qty 120, 30d supply, fill #0

## 2022-10-04 NOTE — Unmapped (Signed)
Pt was offered Flu vaccine today and he declined.

## 2022-10-04 NOTE — Unmapped (Signed)
It was a pleasure talking with you today!     Plan from today's visit    To establish care at [facility] call [phone]. This clinic is a partnership with [employer] ( [facility]). Please be advised that you will need to apply for [facility].     Please note, Medicaid has restarted yearly re-certification. This means you will get a form in the mail that you MUST fill out to keep your Medicaid coverage. This was PAUSED during the Covid-19 pandemic. If you have moved since 2020, please call the number on your Medicaid card to update your address. Be sure to regularly check your mailbox and let us know if you need help when you get the form.        Information about Coronavirus Prevention for Patients with Sickle Cell Disease.  What you can do to protect yourself???   If you have not had your Covid vaccine please consider getting this. You can go to https://rogers.info/ or [email or web address]://hunt-[email or web address] to get your vaccine.     If you have had your shots, please talk with your sickle cell team or primary care provider to determine if you are up to date on your boosters.       What we can do to help you???  Avoid going directly to the ED for pain or minor complaints  The ED is very crowded and you may be exposed to the virus and/or your care may be delayed.  We may be able to treat your pain in clinic instead of the ED, on a case-by-case basis. Call us at   Stay home when you are sick and call us for additional support.   If you are sick, and need an ambulance, CALL 911.    You can reach the sickle cell team at [phone]. You may use this same number on nights and weekends if you are having urgent problems and you will be connected with the on call service.    If you feel you may have been exposed to COVID-19, please reach out to your primary care provider or the sickle cell team via telephone.  For more information please refer to: [email or web address]        How to get in touch with your Sickle Cell Team:      If you are sick, and need an ambulance, CALL 911.    Otherwise, call Leodis Binet at [phone] for:    -appointments           -refills           -general questions.
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
## 2022-10-04 NOTE — Unmapped (Signed)
Met with pt today in clinic  -he Is transitioning his care to [place]  -currently waiting on medicaid to transfer his [place] insurance [place]  -this is likely to occur at the end of October, patient will keep team updated on that matter  -has no other needs at this time        GAD7 Total Score GAD-7 Total Score   10/04/2022  12:00 PM 11        PHQ-9 Score:  PHQ-9 TOTAL SCORE: 14

## 2022-10-08 LAB — ERYTHROPOIETIN: ERYTHROPOIETIN: 143 m[IU]/mL — ABNORMAL HIGH

## 2022-10-17 MED ORDER — IBUPROFEN 400 MG TABLET
ORAL_TABLET | Freq: Four times a day (QID) | ORAL | 0 refills | 8 days | Status: CP | PRN
Start: 2022-10-17 — End: ?

## 2022-10-17 MED ORDER — OXYCODONE 10 MG TABLET
ORAL_TABLET | ORAL | 0 refills | 9 days | Status: CP | PRN
Start: 2022-10-17 — End: ?

## 2022-10-17 NOTE — Unmapped (Signed)
Left a voicemail for patient that refills have been sent.  Warren Miles

## 2022-10-17 NOTE — Unmapped (Signed)
I refilled his Ibuprofen 400mg . He is also requesting Oxycodone 10mg  to be sent to [facility] in [place].

## 2022-10-17 NOTE — Unmapped (Signed)
Patient called for refills on ibuprofen and oxycodone 10mg .

## 2022-10-21 DIAGNOSIS — D571 Sickle-cell disease without crisis: Principal | ICD-10-CM

## 2022-11-01 DIAGNOSIS — D571 Sickle-cell disease without crisis: Principal | ICD-10-CM

## 2022-11-18 DIAGNOSIS — D571 Sickle-cell disease without crisis: Principal | ICD-10-CM

## 2022-11-21 MED ORDER — IBUPROFEN 400 MG TABLET
ORAL_TABLET | Freq: Four times a day (QID) | ORAL | 0 refills | 8 days | PRN
Start: 2022-11-21 — End: ?

## 2022-11-21 MED ORDER — OXYCODONE 10 MG TABLET
ORAL_TABLET | ORAL | 0 refills | 9 days | PRN
Start: 2022-11-21 — End: ?

## 2022-11-25 MED ORDER — IBUPROFEN 400 MG TABLET
ORAL_TABLET | Freq: Four times a day (QID) | ORAL | 0 refills | 8 days | Status: CP | PRN
Start: 2022-11-25 — End: ?

## 2022-11-25 MED ORDER — OXYCODONE 10 MG TABLET
ORAL_TABLET | ORAL | 0 refills | 9 days | Status: CP | PRN
Start: 2022-11-25 — End: ?

## 2022-12-16 DIAGNOSIS — D571 Sickle-cell disease without crisis: Principal | ICD-10-CM

## 2022-12-27 ENCOUNTER — Ambulatory Visit: Admit: 2022-12-27 | Discharge: 2022-12-27 | Payer: MEDICAID

## 2022-12-27 DIAGNOSIS — D571 Sickle-cell disease without crisis: Principal | ICD-10-CM

## 2022-12-27 LAB — TOXICOLOGY SCREEN, URINE
AMPHETAMINE SCREEN URINE: NEGATIVE
BARBITURATE SCREEN URINE: NEGATIVE
BENZODIAZEPINE SCREEN, URINE: NEGATIVE
BUPRENORPHINE, URINE SCREEN: NEGATIVE
CANNABINOID SCREEN URINE: POSITIVE — AB
COCAINE(METAB.)SCREEN, URINE: NEGATIVE
FENTANYL SCREEN, URINE: NEGATIVE
METHADONE SCREEN, URINE: NEGATIVE
OPIATE SCREEN URINE: NEGATIVE
OXYCODONE SCREEN URINE: POSITIVE — AB

## 2022-12-27 LAB — BASIC METABOLIC PANEL
ANION GAP: 4 mmol/L — ABNORMAL LOW (ref 5–14)
BLOOD UREA NITROGEN: 8 mg/dL — ABNORMAL LOW (ref 9–23)
BUN / CREAT RATIO: 10
CALCIUM: 9.6 mg/dL (ref 8.7–10.4)
CHLORIDE: 111 mmol/L — ABNORMAL HIGH (ref 98–107)
CO2: 25.6 mmol/L (ref 20.0–31.0)
CREATININE: 0.82 mg/dL
EGFR CKD-EPI (2021) MALE: >90 mL/min/{1.73_m2} (ref >=60)
GLUCOSE RANDOM: 119 mg/dL — ABNORMAL HIGH (ref 70–99)
POTASSIUM: 4.2 mmol/L (ref 3.4–4.8)
SODIUM: 141 mmol/L (ref 135–145)

## 2022-12-27 LAB — CBC W/ AUTO DIFF
BASOPHILS ABSOLUTE COUNT: 0.1 10*9/L (ref 0.0–0.1)
BASOPHILS RELATIVE PERCENT: 0.6 %
EOSINOPHILS ABSOLUTE COUNT: 0 10*9/L (ref 0.0–0.5)
EOSINOPHILS RELATIVE PERCENT: 0.5 %
HEMATOCRIT: 26.5 % — ABNORMAL LOW (ref 39.0–48.0)
HEMOGLOBIN: 9.5 g/dL — ABNORMAL LOW (ref 12.9–16.5)
LYMPHOCYTES ABSOLUTE COUNT: 2 10*9/L (ref 1.1–3.6)
LYMPHOCYTES RELATIVE PERCENT: 24.7 %
MEAN CORPUSCULAR HEMOGLOBIN CONC: 35.6 g/dL (ref 32.0–36.0)
MEAN CORPUSCULAR HEMOGLOBIN: 44.4 pg — ABNORMAL HIGH (ref 25.9–32.4)
MEAN CORPUSCULAR VOLUME: 124.7 fL — ABNORMAL HIGH (ref 77.6–95.7)
MEAN PLATELET VOLUME: 8.4 fL (ref 6.8–10.7)
MONOCYTES ABSOLUTE COUNT: 0.7 10*9/L (ref 0.3–0.8)
MONOCYTES RELATIVE PERCENT: 8.4 %
NEUTROPHILS ABSOLUTE COUNT: 5.3 10*9/L (ref 1.8–7.8)
NEUTROPHILS RELATIVE PERCENT: 65.8 %
PLATELET COUNT: 209 10*9/L (ref 150–450)
RED BLOOD CELL COUNT: 2.13 10*12/L — ABNORMAL LOW (ref 4.26–5.60)
RED CELL DISTRIBUTION WIDTH: 17.2 % — ABNORMAL HIGH (ref 12.2–15.2)
WBC ADJUSTED: 8.1 10*9/L (ref 3.6–11.2)

## 2022-12-27 LAB — SLIDE REVIEW

## 2022-12-27 LAB — HEPATIC FUNCTION PANEL
ALBUMIN: 4.7 g/dL (ref 3.4–5.0)
ALKALINE PHOSPHATASE: 69 U/L (ref 46–116)
ALT (SGPT): 17 U/L (ref 10–49)
AST (SGOT): 28 U/L (ref <=34)
BILIRUBIN DIRECT: 0.5 mg/dL — ABNORMAL HIGH (ref 0.00–0.30)
BILIRUBIN TOTAL: 1.3 mg/dL — ABNORMAL HIGH (ref 0.3–1.2)
PROTEIN TOTAL: 8 g/dL (ref 5.7–8.2)

## 2022-12-27 LAB — C-REACTIVE PROTEIN: C-REACTIVE PROTEIN: <4 mg/L (ref <=10.0)

## 2022-12-27 LAB — RETICULOCYTES
RETICULOCYTE ABSOLUTE COUNT: 63.2 10*9/L (ref 23.0–100.0)
RETICULOCYTE COUNT PCT: 2.97 % — ABNORMAL HIGH (ref 0.50–2.17)

## 2022-12-27 LAB — LACTATE DEHYDROGENASE: LACTATE DEHYDROGENASE: 419 U/L — ABNORMAL HIGH (ref 120–246)

## 2022-12-27 MED ORDER — HYDROXYZINE HCL 25 MG TABLET
ORAL_TABLET | Freq: Every evening | ORAL | 0 refills | 0 days | Status: CP | PRN
Start: 2022-12-27 — End: ?
  Filled 2022-12-27: qty 30, 30d supply, fill #0

## 2022-12-27 MED ORDER — OXYCODONE 10 MG TABLET
ORAL_TABLET | ORAL | 0 refills | 9 days | Status: CP | PRN
Start: 2022-12-27 — End: ?
  Filled 2022-12-27: qty 50, 9d supply, fill #0

## 2022-12-27 NOTE — Unmapped (Signed)
It was a pleasure talking with you today!     Plan from today's visit    Please consider giving your new sickle cell clinic another shot.   It is really important that you have a provider that is closer to you.   We talked about suboxone today and I would like you to read the paper that I have provided.    Please note, Medicaid has restarted yearly re-certification. This means you will get a form in the mail that you MUST fill out to keep your Medicaid coverage. This was PAUSED during the Covid-19 pandemic. If you have moved since 2020, please call the number on your Medicaid card to update your address. Be sure to regularly check your mailbox and let us know if you need help when you get the form.        Information about Coronavirus Prevention for Patients with Sickle Cell Disease.  What you can do to protect yourself???   If you have not had your Covid vaccine please consider getting this. You can go to https://rogers.info/ or [email or web address] to get your vaccine.     If you have had your shots, please talk with your sickle cell team or primary care provider to determine if you are up to date on your boosters.       What we can do to help you???  Avoid going directly to the ED for pain or minor complaints  The ED is very crowded and you may be exposed to the virus and/or your care may be delayed.  We may be able to treat your pain in clinic instead of the ED, on a case-by-case basis. Call us at   Stay home when you are sick and call us for additional support.   If you are sick, and need an ambulance, CALL 911.    You can reach the sickle cell team at [phone]. You may use this same number on nights and weekends if you are having urgent problems and you will be connected with the on call service.    If you feel you may have been exposed to COVID-19, please reach out to your primary care provider or the sickle cell team via telephone.  For more information please refer to: [email or web address]        How to get in touch with your Sickle Cell Team:      If you are sick, and need an ambulance, CALL 911.    Otherwise, call Leodis Binet at [phone] for:    -appointments           -refills           -general questions.

## 2022-12-27 NOTE — Unmapped (Signed)
Social Work Note  [facility]      Met with patient while in clinic today. Patient met with new provider in [place]. Expressed he felt  like a number and there was not the level of patient care he recent here at [facility]. We spoke the about the drive up here (5hrs) and how that could be taxing on him. 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 Patient stated his insurance can allow him to see provider here due to it being a specialty clinic.  SW will cont to follow.
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
## 2023-01-10 NOTE — Unmapped (Signed)
Pt called for refill: Oxycodone 10mg   to [id number] - [place], [place] - [address]   Last script was 12/27/22 #50 tabs .  Next appt 01/03/23 @ 11am with Bruna Potter NP.  Pt states he will be scheduling with Sickle Cell provider in [place] in March (perhaps).

## 2023-01-13 DIAGNOSIS — D571 Sickle-cell disease without crisis: Principal | ICD-10-CM

## 2023-01-13 MED ORDER — OXYCODONE 10 MG TABLET
ORAL_TABLET | ORAL | 0 refills | 9 days | Status: CP | PRN
Start: 2023-01-13 — End: ?

## 2023-01-24 MED ORDER — HYDROXYZINE HCL 25 MG TABLET
ORAL_TABLET | Freq: Every evening | ORAL | 0 refills | 30 days | Status: CP | PRN
Start: 2023-01-24 — End: ?

## 2023-01-24 NOTE — Unmapped (Signed)
Pt called for Oxycodone and Hydroxyzine refills to [facility] in [place].   Last Oxy10 mg script was 01/13/23  #50 tabs  Hydroxyzine 25mg  # 30 12/27/22 #30 tabs

## 2023-01-27 MED ORDER — OXYCODONE 10 MG TABLET
ORAL_TABLET | ORAL | 0 refills | 9 days | Status: CP | PRN
Start: 2023-01-27 — End: ?

## 2023-02-10 DIAGNOSIS — D571 Sickle-cell disease without crisis: Principal | ICD-10-CM

## 2023-02-11 MED ORDER — HYDROXYZINE HCL 25 MG TABLET
ORAL_TABLET | Freq: Every evening | ORAL | 0 refills | 30 days | Status: CP | PRN
Start: 2023-02-11 — End: ?

## 2023-02-11 MED ORDER — METOPROLOL SUCCINATE ER 25 MG TABLET,EXTENDED RELEASE 24 HR
ORAL_TABLET | Freq: Every evening | ORAL | 0 refills | 0.00000 days | Status: CP
Start: 2023-02-11 — End: 2024-02-11

## 2023-02-11 MED ORDER — OXYCODONE 10 MG TABLET
ORAL_TABLET | ORAL | 0 refills | 9 days | Status: CP | PRN
Start: 2023-02-11 — End: ?

## 2023-02-11 NOTE — Unmapped (Signed)
Pt called for a few reasons:  1- Cancel upcoming appt in our clinic because Medicaid will no longer pay for him to come to [facility] for care, they have assigned him a new provider., appt on 03/10/23    2- Refills: Oxycodone 10mg  to [facility] in [place].  Last script was 01/27/2023 #50 tabs.    Hydroxyzine 25mg   last script 01/24/2023 #30 tabs (has a few left)  Metoprolol 25mg    last script given 10/04/22 #30   (he says he takes it ...)     3- Pt is asking if you would provide pain med scripts until he is seen at new provider on 03/09/22 in [place], [place].     Last Visit with Satira Sark, NP was 12/27/2022  Next appt on 02/17/23 has been cancelled per pt request.

## 2023-02-11 NOTE — Unmapped (Signed)
Spoke with patient, thanks! He will cancel Neurosurgery appt.   Christella Scheuermann, RN

## 2023-02-25 MED ORDER — OXYCODONE 10 MG TABLET
ORAL_TABLET | ORAL | 0 refills | 9 days | Status: CP | PRN
Start: 2023-02-25 — End: ?

## 2023-03-09 MED ORDER — HYDROXYZINE HCL 25 MG TABLET
ORAL_TABLET | 0 refills | 0 days
Start: 2023-03-09 — End: ?

## 2023-03-09 MED ORDER — METOPROLOL SUCCINATE ER 25 MG TABLET,EXTENDED RELEASE 24 HR
ORAL_TABLET | 0 refills | 0 days
Start: 2023-03-09 — End: ?

## 2023-03-10 MED ORDER — HYDROXYZINE HCL 25 MG TABLET
ORAL_TABLET | 0 refills | 0 days | Status: CP
Start: 2023-03-10 — End: ?

## 2023-03-10 MED ORDER — METOPROLOL SUCCINATE ER 25 MG TABLET,EXTENDED RELEASE 24 HR
ORAL_TABLET | 0 refills | 0 days | Status: CP
Start: 2023-03-10 — End: ?

## 2023-03-13 MED ORDER — IBUPROFEN 400 MG TABLET
ORAL_TABLET | 0 refills | 0 days | Status: CP
Start: 2023-03-13 — End: ?

## 2023-03-31 NOTE — Unmapped (Signed)
Specialty Medication(s): Hydroxyurea    Warren Miles has been dis-enrolled from the [facility] specialty pharmacy services due to a pharmacy change. The patient is now filling at pharmacy in [facility] .    Additional information provided to the patient: Pt moved to [facility] Vangie Bicker, PharmD  [facility] Specialty Pharmacist
